# Patient Record
Sex: Female | Born: 1980 | Race: White | Hispanic: No | Marital: Married | Smoking: Current some day smoker
Health system: Southern US, Community
[De-identification: ages and names within clinical notes are randomized; demographics above are authoritative.]

## PROBLEM LIST (undated history)

## (undated) DIAGNOSIS — J020 Streptococcal pharyngitis: Secondary | ICD-10-CM

## (undated) DIAGNOSIS — M549 Dorsalgia, unspecified: Secondary | ICD-10-CM

## (undated) HISTORY — DX: Streptococcal pharyngitis: J02.0

## (undated) HISTORY — DX: Dorsalgia, unspecified: M54.9

## (undated) HISTORY — PX: TUBAL LIGATION: SHX77

---

## 2005-06-09 ENCOUNTER — Observation Stay: Payer: Self-pay | Admitting: Obstetrics & Gynecology

## 2005-06-11 ENCOUNTER — Observation Stay: Payer: Self-pay

## 2005-06-12 ENCOUNTER — Inpatient Hospital Stay: Payer: Self-pay | Admitting: Unknown Physician Specialty

## 2005-06-18 ENCOUNTER — Inpatient Hospital Stay: Payer: Self-pay | Admitting: Unknown Physician Specialty

## 2006-01-04 ENCOUNTER — Ambulatory Visit: Payer: Self-pay | Admitting: Unknown Physician Specialty

## 2007-02-05 ENCOUNTER — Emergency Department: Payer: Self-pay | Admitting: Emergency Medicine

## 2007-06-03 ENCOUNTER — Emergency Department: Payer: Self-pay | Admitting: Emergency Medicine

## 2009-11-25 ENCOUNTER — Ambulatory Visit: Payer: Self-pay | Admitting: Family Medicine

## 2009-11-25 DIAGNOSIS — J020 Streptococcal pharyngitis: Secondary | ICD-10-CM | POA: Insufficient documentation

## 2009-11-25 LAB — CONVERTED CEMR LAB: Rapid Strep: POSITIVE

## 2009-11-29 ENCOUNTER — Telehealth: Payer: Self-pay | Admitting: Family Medicine

## 2009-12-16 ENCOUNTER — Ambulatory Visit: Payer: Self-pay | Admitting: Family Medicine

## 2009-12-16 ENCOUNTER — Encounter: Admission: RE | Admit: 2009-12-16 | Discharge: 2009-12-16 | Payer: Self-pay | Admitting: Family Medicine

## 2009-12-16 DIAGNOSIS — N61 Mastitis without abscess: Secondary | ICD-10-CM

## 2009-12-17 LAB — CONVERTED CEMR LAB
Basophils Absolute: 0.2 10*3/uL — ABNORMAL HIGH (ref 0.0–0.1)
Eosinophils Absolute: 0.2 10*3/uL (ref 0.0–0.7)
Eosinophils Relative: 2.1 % (ref 0.0–5.0)
Hemoglobin: 13.1 g/dL (ref 12.0–15.0)
Lymphs Abs: 2.5 10*3/uL (ref 0.7–4.0)
MCV: 91.7 fL (ref 78.0–100.0)
Monocytes Absolute: 0.5 10*3/uL (ref 0.1–1.0)
Neutro Abs: 8.3 10*3/uL — ABNORMAL HIGH (ref 1.4–7.7)
Neutrophils Relative %: 71.4 % (ref 43.0–77.0)
Platelets: 312 10*3/uL (ref 150.0–400.0)

## 2010-03-01 ENCOUNTER — Telehealth (INDEPENDENT_AMBULATORY_CARE_PROVIDER_SITE_OTHER): Payer: Self-pay | Admitting: *Deleted

## 2010-09-08 ENCOUNTER — Encounter: Payer: Self-pay | Admitting: Family Medicine

## 2010-09-08 ENCOUNTER — Telehealth: Payer: Self-pay | Admitting: Family Medicine

## 2010-09-08 ENCOUNTER — Ambulatory Visit: Payer: Self-pay | Admitting: Family Medicine

## 2010-09-08 DIAGNOSIS — R42 Dizziness and giddiness: Secondary | ICD-10-CM

## 2010-09-08 LAB — CONVERTED CEMR LAB: Hemoglobin: 16.2 g/dL

## 2010-09-09 LAB — CONVERTED CEMR LAB
Basophils Relative: 0.3 % (ref 0.0–3.0)
Eosinophils Relative: 2.9 % (ref 0.0–5.0)
Hemoglobin: 13.6 g/dL (ref 12.0–15.0)
Lymphocytes Relative: 24.8 % (ref 12.0–46.0)
Lymphs Abs: 2.6 10*3/uL (ref 0.7–4.0)
MCV: 89.5 fL (ref 78.0–100.0)
Neutro Abs: 7.1 10*3/uL (ref 1.4–7.7)
Neutrophils Relative %: 67.1 % (ref 43.0–77.0)
RBC: 4.39 M/uL (ref 3.87–5.11)
RDW: 13.7 % (ref 11.5–14.6)

## 2010-10-16 ENCOUNTER — Encounter: Payer: Self-pay | Admitting: Family Medicine

## 2010-10-25 NOTE — Assessment & Plan Note (Signed)
Summary: NEW PT-ACUTE VISIT-STREP THROAT CYD   Vital Signs:  Patient profile:   30 year old female Height:      65 inches Weight:      249.75 pounds BMI:     41.71 Temp:     98.2 degrees F oral Pulse rate:   104 / minute Pulse rhythm:   regular BP sitting:   114 / 76  (left arm) Cuff size:   large  Vitals Entered By: Lewanda Rife LPN (November 25, 1608 9:31 AM)  History of Present Illness: sore throat yest am  then fever last night with aches and chills  was in bed by 7   2 weeks ago was around her nanny with strep   no fever now -- took some tylenol no other otc meds   no congestion or cough and no headache  usually gets strep once per year   no chance pregnant right now  Allergies (verified): No Known Drug Allergies  Past History:  Social History: Last updated: 11/25/2009 occasional smoker - trying to quit   Past Medical History: healthy   Past Surgical History: no surgeries   Social History: occasional smoker - trying to quit   Review of Systems General:  Complains of chills, fatigue, fever, loss of appetite, and malaise. Eyes:  Denies discharge and eye irritation. ENT:  Complains of sore throat; denies nasal congestion, postnasal drainage, and sinus pressure. CV:  Denies chest pain or discomfort and palpitations. Resp:  Denies cough and wheezing. GI:  Denies abdominal pain and change in bowel habits. MS:  Denies joint pain. Derm:  Denies rash. Heme:  Denies abnormal bruising and enlarge lymph nodes.  Physical Exam  General:  overweight but generally well appearing  Head:  normocephalic, atraumatic, and no abnormalities observed.   Eyes:  vision grossly intact, pupils equal, pupils round, pupils reactive to light, and no injection.   Ears:  R ear normal and L ear normal.   Nose:  no nasal discharge.   Mouth:  erythematous throat with tonsillar swelling and scant exudate Neck:  No deformities, masses, or tenderness noted. Lungs:  Normal respiratory  effort, chest expands symmetrically. Lungs are clear to auscultation, no crackles or wheezes. Heart:  Normal rate and regular rhythm. S1 and S2 normal without gallop, murmur, click, rub or other extra sounds. Skin:  Intact without suspicious lesions or rashes Cervical Nodes:  No lymphadenopathy noted Psych:  normal affect, talkative and pleasant    Impression & Recommendations:  Problem # 1:  STREP THROAT (ICD-034.0) Assessment New  uncomplicated with fever and st  tx with amox for 10 days recommend sympt care- see pt instructions   pt advised to update me if symptoms worsen or do not improve  back to work monday if feelingbetter  Her updated medication list for this problem includes:    Tylenol 325 Mg Tabs (Acetaminophen) ..... Otc as directed.    Amoxicillin 500 Mg Caps (Amoxicillin) .Marland Kitchen... 1 by mouth three times a day for 10 days  Orders: Prescription Created Electronically [phone]) Rapid Strep (40981)  Complete Medication List: 1)  Tylenol 325 Mg Tabs (Acetaminophen) .... Otc as directed. 2)  Amoxicillin 500 Mg Caps (Amoxicillin) .Marland Kitchen.. 1 by mouth three times a day for 10 days  Patient Instructions: 1)  drink lots of fluids and get rest  2)  tylenol and chloraseptic spray for throat pain  3)  take amox as directed  4)  update if not improved by monday 5)  can return to work then if feeling better  6)  I sent px to your pharmacy Prescriptions: AMOXICILLIN 500 MG CAPS (AMOXICILLIN) 1 by mouth three times a day for 10 days  #30 x 0   Entered and Authorized by:   Judith Part MD   Signed by:   Judith Part MD on 11/25/2009   Method used:   [facility] to        [id number]       [address]       [place], [place]  [address]       Ph: [phone] or [phone]       Fax: [phone]   RxID:   [id number]   Current Allergies (reviewed today): No known allergies   Laboratory Results  Date/Time Received: November 25, 2009 9:32 AM  Date/Time Reported: November 25, 2009 9:32 AM   Other Tests  Rapid Strep: positive  Kit Test Internal QC: Positive   (Normal Range: Negative)

## 2010-10-25 NOTE — Progress Notes (Signed)
Summary: Not feeling any better  Phone Note Call from Patient Call back at Home Phone [phone]   Caller: Patient Call For: Dr. Milinda Antis Summary of Call: Patient called and states that she is still running low grade fever 100.0 and 100.1, taking Tylenol every 4-6 hours.  Diarrhea since Friday, throat still sore, says its not much better than Thursday.  Able to eat soup and drinking fluids, hurts to swallow.  Wanted to know if she can have something for the diarrhea.    [facility]   Initial call taken by: Linde Gillis CMA Duncan Dull),  November 29, 2009 8:09 AM  Follow-up for Phone Call        since the amox is giving her such bad diarrhea - lets go ahead and try zpak instead  px written on EMR for call in  can try a dose of immodium no more than two times a day-- and stop it if it gives her abd bloating or pain let me know if not starting to improve in several days Follow-up by: Judith Part MD,  November 29, 2009 8:44 AM  Additional Follow-up for Phone Call Additional follow up Details #1::        Mr. Marylou Flesher notified as instructed by telephone. Pt had given Mr Marylou Flesher cell # for contact. Pt to stop Amoxicillin and start Zpak. [facility] as instructed. Lewanda Rife LPN  November 30, 6071 12:53 PM     New/Updated Medications: ZITHROMAX Z-PAK 250 MG TABS (AZITHROMYCIN) take by mouth as directed Prescriptions: ZITHROMAX Z-PAK 250 MG TABS (AZITHROMYCIN) take by mouth as directed  #1 pack x 0   Entered and Authorized by:   Judith Part MD   Signed by:   Lewanda Rife LPN on 71/02/2693   Method used:   Telephoned to ...       [id number]       [address]       [place], [place]  [address]       Ph: [phone] or [phone]       Fax: [phone]   RxID:   [id number]

## 2010-10-25 NOTE — Progress Notes (Signed)
Summary: Breast Ultrasound  Phone Note From Other Clinic Call back at [phone] ext [phone]   Caller: [facility] Call For: Dr. Dayton Martes Summary of Call: Patient needs to reschedule breast ultrasound or call the office and let them know that she is no longer having any problems, finished antibiotics, etc.  They have to have this information on file either way.  Please advise. Initial call taken by: Linde Gillis CMA Duncan Dull),  March 01, 2010 12:07 PM  Follow-up for Phone Call        Will forward to [place]. Ruthe Mannan MD  March 01, 2010 12:08 PM   Spoke with Olegario Messier and updated her that my issue has cleared up.  Follow-up by: Melody Comas,  March 01, 2010 3:19 PM

## 2010-10-25 NOTE — Assessment & Plan Note (Signed)
Summary: sore breast/ alc   Vital Signs:  Patient profile:   30 year old female Height:      65 inches Weight:      249.38 pounds BMI:     41.65 Temp:     98.5 degrees F oral Pulse rate:   88 / minute Pulse rhythm:   regular BP sitting:   122 / 82  (left arm) Cuff size:   large  Vitals Entered By: Delilah Shan CMA Duncan Dull) (December 16, 2009 2:49 PM) CC: Sore breast   History of Present Illness: 49 with 2 days of left breast soreness.  Soreness is in the areaola area, with erythema which is spreadiing outward. No discharge or inversion of nipple. No fevers, chills. She has had more fatigue lately but no other signs of systemic illness. She is a smoker. Not breast feeding and no recent sexual activities involving breast and trauma to her breast.  She is very concerned because maternal great aunt died of breast cancer in her 46s. LMP was last week.   Current Medications (verified): 1)  Tylenol 325 Mg Tabs (Acetaminophen) .... Otc As Directed. 2)  Augmentin 875-125 Mg Tabs (Amoxicillin-Pot Clavulanate) .Marland Kitchen.. 1 By Mouth 2 Times Daily X 10 Days  Allergies (verified): No Known Drug Allergies  Review of Systems      See HPI General:  Complains of fatigue; denies chills and fever. GU:  Denies abnormal vaginal bleeding.  Physical Exam  General:  overweight but generally well appearing  Breasts:  Left areaola red, warm to touch, palpable painful mass under nipple.  no abnormal thickening, no nipple discharge, and no adenopathy.   Psych:  normal affect, talkative and pleasant    Impression & Recommendations:  Problem # 1:  ABSCESS, BREAST, LEFT (ICD-611.0) Assessment New ?possible periductal abscess/mastitis. Will check CBC, start on Augmentin and get immediate ultrasound of left breast.  Orders: Radiology Referral (Radiology) Venipuncture (35361) TLB-CBC Platelet - w/Differential (85025-CBCD)  Complete Medication List: 1)  Tylenol 325 Mg Tabs (Acetaminophen) ....  Otc as directed. 2)  Augmentin 875-125 Mg Tabs (Amoxicillin-pot clavulanate) .Marland Kitchen.. 1 by mouth 2 times daily x 10 days Prescriptions: AUGMENTIN 875-125 MG TABS (AMOXICILLIN-POT CLAVULANATE) 1 by mouth 2 times daily x 10 days  #20 x 0   Entered and Authorized by:   Ruthe Mannan MD   Signed by:   Ruthe Mannan MD on 12/16/2009   Method used:   [facility] to        [id number]       [address]       [place], [place]  [address]       Ph: [phone] or [phone]       Fax: [phone]   RxID:   [id number]   Current Allergies (reviewed today): No known allergies

## 2010-10-27 NOTE — Progress Notes (Signed)
Summary: HGB 16.2, feeling light headed  Phone Note Call from Patient   Call For: Ruthe Mannan MD Summary of Call: Pt not feeling well, lightheaded since around lunch time. HGB 16.2 See Morrie Sheldon with any instructions. Initial call taken by: Mills Koller,  September 08, 2010 1:54 PM  Follow-up for Phone Call        Blood sugar 133 blood pressure 130/90.  Linde Gillis CMA Duncan Dull)  September 08, 2010 2:14 PM  Thank you, let's check a CBC, order placed. Ruthe Mannan MD  September 08, 2010 2:29 PM   New Problems: DIZZINESS (ICD-780.4)   New Problems: DIZZINESS (ICD-780.4)  Laboratory Results   Blood Tests   Date/Time Recieved: September 08, 2010 1:53 PM Date/Time Reported: September 08, 2010 1:53 PM   CBC HGB:  16.2 g/dL   (Normal Range: 52.8-41.3 in Males, 12.0-15.0 in Females)

## 2010-11-21 ENCOUNTER — Telehealth: Payer: Self-pay | Admitting: Family Medicine

## 2010-12-01 NOTE — Progress Notes (Signed)
Summary: Infection in breast  Phone Note Call from Patient Call back at Work Phone [phone]   Caller: Patient Call For: Ruthe Mannan MD Summary of Call: Patient wanted to know if she can have an antibiotic called in because her breast is infected again.  She states that this happens when her menstral cycle starts.  [facility]  Please advise. Initial call taken by: Linde Gillis CMA Duncan Dull),  November 21, 2010 11:35 AM  Follow-up for Phone Call        yes. please have her let me know if infection worsens, i.e if she develops a fever, redness spreads or pain worsens on Augmentin. Ruthe Mannan MD  November 21, 2010 11:43 AM  Patient advised as instructed via telephone.  Rx sent to pharmacy.  Follow-up by: Linde Gillis CMA Duncan Dull),  November 21, 2010 11:57 AM    New/Updated Medications: AUGMENTIN 875-125 MG TABS (AMOXICILLIN-POT CLAVULANATE) 1 by mouth 2 times daily x 10 days Prescriptions: AUGMENTIN 875-125 MG TABS (AMOXICILLIN-POT CLAVULANATE) 1 by mouth 2 times daily x 10 days  #20 x 0   Entered and Authorized by:   Ruthe Mannan MD   Signed by:   Ruthe Mannan MD on 11/21/2010   Method used:   [facility]        [id number]       [address]       [place], [place]  [address]       Ph: [phone] or [phone]       Fax: [phone]   RxID:   [id number]

## 2011-02-09 ENCOUNTER — Other Ambulatory Visit: Payer: Self-pay | Admitting: Family Medicine

## 2011-02-09 ENCOUNTER — Telehealth (INDEPENDENT_AMBULATORY_CARE_PROVIDER_SITE_OTHER): Payer: Self-pay | Admitting: *Deleted

## 2011-02-09 DIAGNOSIS — R5383 Other fatigue: Secondary | ICD-10-CM

## 2011-02-09 DIAGNOSIS — R5381 Other malaise: Secondary | ICD-10-CM

## 2011-02-09 LAB — CBC WITH DIFFERENTIAL/PLATELET
Basophils Absolute: 0 10*3/uL (ref 0.0–0.1)
Basophils Relative: 0.3 % (ref 0.0–3.0)
Eosinophils Absolute: 0.2 10*3/uL (ref 0.0–0.7)
MCV: 90.2 fl (ref 78.0–100.0)
Monocytes Absolute: 0.6 10*3/uL (ref 0.1–1.0)
Neutrophils Relative %: 69.3 % (ref 43.0–77.0)

## 2011-02-09 LAB — TSH: TSH: 2.45 u[IU]/mL (ref 0.35–5.50)

## 2011-02-09 NOTE — Telephone Encounter (Signed)
Patient would like her Thyroid check. Symptoms are fatigue, no period, weight gain. Can she have this done

## 2011-02-09 NOTE — Telephone Encounter (Signed)
Yes. Orders entered

## 2011-02-09 NOTE — Telephone Encounter (Signed)
Patient advised.

## 2011-11-08 ENCOUNTER — Encounter: Payer: Self-pay | Admitting: Internal Medicine

## 2011-11-08 ENCOUNTER — Ambulatory Visit (INDEPENDENT_AMBULATORY_CARE_PROVIDER_SITE_OTHER): Payer: Self-pay | Admitting: Internal Medicine

## 2011-11-08 VITALS — BP 123/85 | HR 84 | Temp 98.4°F

## 2011-11-08 DIAGNOSIS — M79609 Pain in unspecified limb: Secondary | ICD-10-CM

## 2011-11-08 DIAGNOSIS — M79671 Pain in right foot: Secondary | ICD-10-CM | POA: Insufficient documentation

## 2011-11-08 MED ORDER — TRAMADOL HCL 50 MG PO TABS: 50.0000 mg | ORAL_TABLET | Freq: Three times a day (TID) | ORAL | Status: AC | PRN

## 2011-11-08 MED ORDER — CELECOXIB 200 MG PO CAPS: 200.0000 mg | ORAL_CAPSULE | Freq: Two times a day (BID) | ORAL | Status: AC

## 2011-11-08 NOTE — Assessment & Plan Note (Signed)
Acute onset right medial foot pain. Unclear etiology. No obvious trauma, however location of pain and swelling suggest 2nd or 3rd metatarsal fracture. Will get plain xray for evaluation.  Will start Celebrex bid and use tramadol prn severe pain.

## 2011-11-08 NOTE — Progress Notes (Signed)
  Subjective:    Patient ID: Jacqueline Robinson, female    DOB: 1980/12/09, 31 y.o.   MRN: [id number]  HPI 31 year old female presents for acute visit complaining of sudden onset right foot pain. Pain started during the night and woke her from sleep. Pain is described as severe and sharp sometimes radiating around the lateral ankle and up the right leg. Pain is mostly located over the right medial foot, on the top of the foot. There is minimal swelling in this area. There are no overlying skin changes. Pain limits mobility as it is increased with weight bearing. Patient denies any known trauma to the area. However, questions whether dog may have stepped on foot. Patient has not been taking any medication for pain.  Outpatient Encounter Prescriptions as of 11/08/2011  Medication Sig Dispense Refill  . celecoxib (CELEBREX) 200 MG capsule Take 1 capsule (200 mg total) by mouth 2 (two) times daily.  60 capsule  3  . traMADol (ULTRAM) 50 MG tablet Take 1 tablet (50 mg total) by mouth every 8 (eight) hours as needed for pain.  30 tablet  0      Review of Systems  Constitutional: Negative for fever and chills.  Musculoskeletal: Positive for myalgias and arthralgias.  Skin: Negative for color change and rash.   BP 123/85  Pulse 84  Temp 98.4 F (36.9 C)  SpO2 98%     Objective:   Physical Exam  Constitutional: She is oriented to person, place, and time. She appears well-developed and well-nourished. No distress.  HENT:  Head: Normocephalic and atraumatic.  Eyes: Pupils are equal, round, and reactive to light.  Neck: Normal range of motion.  Pulmonary/Chest: Effort normal.  Musculoskeletal:       Right foot: She exhibits tenderness and swelling.       Feet:  Neurological: She is alert and oriented to person, place, and time.  Skin: She is not diaphoretic.          Assessment & Plan:

## 2011-11-09 ENCOUNTER — Ambulatory Visit (INDEPENDENT_AMBULATORY_CARE_PROVIDER_SITE_OTHER)
Admission: RE | Admit: 2011-11-09 | Discharge: 2011-11-09 | Disposition: A | Discharge status: Home / Self Care | Payer: Self-pay | Source: Ambulatory Visit | Attending: Internal Medicine | Admitting: Internal Medicine

## 2011-11-09 DIAGNOSIS — M79609 Pain in unspecified limb: Secondary | ICD-10-CM

## 2011-11-09 DIAGNOSIS — M79671 Pain in right foot: Secondary | ICD-10-CM

## 2011-11-12 ENCOUNTER — Other Ambulatory Visit: Payer: Self-pay | Admitting: Internal Medicine

## 2011-11-12 DIAGNOSIS — J020 Streptococcal pharyngitis: Secondary | ICD-10-CM

## 2011-11-12 MED ORDER — AMOXICILLIN 500 MG PO CAPS: 500.0000 mg | ORAL_CAPSULE | Freq: Three times a day (TID) | ORAL | Status: AC

## 2011-12-20 ENCOUNTER — Other Ambulatory Visit: Payer: Self-pay | Admitting: Internal Medicine

## 2011-12-20 DIAGNOSIS — N390 Urinary tract infection, site not specified: Secondary | ICD-10-CM

## 2011-12-20 MED ORDER — SULFAMETHOXAZOLE-TRIMETHOPRIM 800-160 MG PO TABS: 1.0000 | ORAL_TABLET | Freq: Two times a day (BID) | ORAL | Status: AC

## 2013-04-15 ENCOUNTER — Ambulatory Visit: Payer: Self-pay | Admitting: Internal Medicine

## 2014-06-15 ENCOUNTER — Ambulatory Visit: Payer: Self-pay | Admitting: Internal Medicine

## 2015-02-18 ENCOUNTER — Other Ambulatory Visit: Payer: Self-pay | Admitting: Otolaryngology

## 2015-02-18 DIAGNOSIS — H9202 Otalgia, left ear: Secondary | ICD-10-CM

## 2015-02-25 ENCOUNTER — Ambulatory Visit: Admission: RE | Admit: 2015-02-25 | Payer: 59 | Source: Ambulatory Visit

## 2015-03-05 ENCOUNTER — Ambulatory Visit
Admission: RE | Admit: 2015-03-05 | Discharge: 2015-03-05 | Disposition: A | Discharge status: Home / Self Care | Payer: 59 | Source: Ambulatory Visit | Attending: Otolaryngology | Admitting: Otolaryngology

## 2015-03-05 DIAGNOSIS — H9202 Otalgia, left ear: Secondary | ICD-10-CM | POA: Insufficient documentation

## 2015-03-05 MED ORDER — GADOBENATE DIMEGLUMINE 529 MG/ML IV SOLN
20.0000 mL | Freq: Once | INTRAVENOUS | Status: AC | PRN
  Administered 2015-03-05: 20 mL via INTRAVENOUS

## 2016-02-05 ENCOUNTER — Other Ambulatory Visit: Payer: Self-pay | Admitting: Cardiology

## 2016-02-05 DIAGNOSIS — R0602 Shortness of breath: Secondary | ICD-10-CM

## 2016-02-05 DIAGNOSIS — J209 Acute bronchitis, unspecified: Secondary | ICD-10-CM

## 2016-03-22 DIAGNOSIS — H52223 Regular astigmatism, bilateral: Secondary | ICD-10-CM | POA: Diagnosis not present

## 2016-05-11 ENCOUNTER — Ambulatory Visit
Admission: RE | Admit: 2016-05-11 | Discharge: 2016-05-11 | Disposition: A | Discharge status: Home / Self Care | Payer: 59 | Source: Ambulatory Visit | Attending: Internal Medicine | Admitting: Internal Medicine

## 2016-05-11 ENCOUNTER — Ambulatory Visit
Admission: RE | Admit: 2016-05-11 | Discharge: 2016-05-11 | Disposition: A | Discharge status: Home / Self Care | Payer: 59 | Source: Ambulatory Visit | Attending: Cardiology | Admitting: Cardiology

## 2016-05-11 DIAGNOSIS — R0602 Shortness of breath: Secondary | ICD-10-CM

## 2016-05-11 DIAGNOSIS — J209 Acute bronchitis, unspecified: Secondary | ICD-10-CM | POA: Insufficient documentation

## 2016-05-11 DIAGNOSIS — R05 Cough: Secondary | ICD-10-CM | POA: Diagnosis not present

## 2016-05-19 DIAGNOSIS — R5381 Other malaise: Secondary | ICD-10-CM | POA: Diagnosis not present

## 2016-05-19 DIAGNOSIS — E784 Other hyperlipidemia: Secondary | ICD-10-CM | POA: Diagnosis not present

## 2016-05-19 DIAGNOSIS — R635 Abnormal weight gain: Secondary | ICD-10-CM | POA: Diagnosis not present

## 2016-05-19 DIAGNOSIS — I10 Essential (primary) hypertension: Secondary | ICD-10-CM | POA: Diagnosis not present

## 2016-06-01 DIAGNOSIS — R5381 Other malaise: Secondary | ICD-10-CM | POA: Diagnosis not present

## 2016-06-01 DIAGNOSIS — D72828 Other elevated white blood cell count: Secondary | ICD-10-CM | POA: Diagnosis not present

## 2016-06-02 DIAGNOSIS — R5381 Other malaise: Secondary | ICD-10-CM | POA: Diagnosis not present

## 2016-06-02 DIAGNOSIS — D72829 Elevated white blood cell count, unspecified: Secondary | ICD-10-CM | POA: Diagnosis not present

## 2016-06-02 DIAGNOSIS — R635 Abnormal weight gain: Secondary | ICD-10-CM | POA: Diagnosis not present

## 2016-06-02 DIAGNOSIS — D72828 Other elevated white blood cell count: Secondary | ICD-10-CM | POA: Diagnosis not present

## 2016-06-13 DIAGNOSIS — D72828 Other elevated white blood cell count: Secondary | ICD-10-CM | POA: Diagnosis not present

## 2016-06-13 DIAGNOSIS — D72829 Elevated white blood cell count, unspecified: Secondary | ICD-10-CM | POA: Diagnosis not present

## 2016-06-13 DIAGNOSIS — R5381 Other malaise: Secondary | ICD-10-CM | POA: Diagnosis not present

## 2016-06-14 DIAGNOSIS — R5381 Other malaise: Secondary | ICD-10-CM | POA: Diagnosis not present

## 2016-06-14 DIAGNOSIS — R609 Edema, unspecified: Secondary | ICD-10-CM | POA: Diagnosis not present

## 2016-06-14 DIAGNOSIS — D72828 Other elevated white blood cell count: Secondary | ICD-10-CM | POA: Diagnosis not present

## 2016-06-19 DIAGNOSIS — D72829 Elevated white blood cell count, unspecified: Secondary | ICD-10-CM | POA: Diagnosis not present

## 2016-06-19 DIAGNOSIS — R5381 Other malaise: Secondary | ICD-10-CM | POA: Diagnosis not present

## 2016-06-19 DIAGNOSIS — D72828 Other elevated white blood cell count: Secondary | ICD-10-CM | POA: Diagnosis not present

## 2016-06-23 DIAGNOSIS — D72828 Other elevated white blood cell count: Secondary | ICD-10-CM | POA: Diagnosis not present

## 2016-06-23 DIAGNOSIS — R5381 Other malaise: Secondary | ICD-10-CM | POA: Diagnosis not present

## 2016-06-23 DIAGNOSIS — D72829 Elevated white blood cell count, unspecified: Secondary | ICD-10-CM | POA: Diagnosis not present

## 2016-06-29 DIAGNOSIS — R5381 Other malaise: Secondary | ICD-10-CM | POA: Diagnosis not present

## 2016-06-29 DIAGNOSIS — D72829 Elevated white blood cell count, unspecified: Secondary | ICD-10-CM | POA: Diagnosis not present

## 2016-06-29 DIAGNOSIS — D72828 Other elevated white blood cell count: Secondary | ICD-10-CM | POA: Diagnosis not present

## 2016-07-04 ENCOUNTER — Inpatient Hospital Stay: Payer: 59

## 2016-07-04 ENCOUNTER — Encounter (INDEPENDENT_AMBULATORY_CARE_PROVIDER_SITE_OTHER): Payer: Self-pay

## 2016-07-04 ENCOUNTER — Inpatient Hospital Stay: Payer: 59 | Admitting: Hematology and Oncology

## 2016-07-04 ENCOUNTER — Other Ambulatory Visit: Payer: Self-pay

## 2016-07-04 ENCOUNTER — Inpatient Hospital Stay: Payer: 59 | Attending: Internal Medicine | Admitting: Internal Medicine

## 2016-07-04 VITALS — BP 155/103 | HR 90 | Temp 98.1°F | Ht 69.0 in | Wt 294.5 lb

## 2016-07-04 DIAGNOSIS — L732 Hidradenitis suppurativa: Secondary | ICD-10-CM | POA: Diagnosis not present

## 2016-07-04 DIAGNOSIS — I10 Essential (primary) hypertension: Secondary | ICD-10-CM | POA: Insufficient documentation

## 2016-07-04 DIAGNOSIS — R21 Rash and other nonspecific skin eruption: Secondary | ICD-10-CM | POA: Diagnosis not present

## 2016-07-04 DIAGNOSIS — F17210 Nicotine dependence, cigarettes, uncomplicated: Secondary | ICD-10-CM | POA: Insufficient documentation

## 2016-07-04 DIAGNOSIS — Z79899 Other long term (current) drug therapy: Secondary | ICD-10-CM | POA: Diagnosis not present

## 2016-07-04 DIAGNOSIS — D72829 Elevated white blood cell count, unspecified: Secondary | ICD-10-CM | POA: Insufficient documentation

## 2016-07-04 LAB — CBC WITH DIFFERENTIAL/PLATELET
BASOS ABS: 0.1 10*3/uL (ref 0–0.1)
BASOS PCT: 1 %
Eosinophils Absolute: 0.3 10*3/uL (ref 0–0.7)
Eosinophils Relative: 3 %
HEMATOCRIT: 43.1 % (ref 35.0–47.0)
Hemoglobin: 14.6 g/dL (ref 12.0–16.0)
LYMPHS PCT: 21 %
Lymphs Abs: 2.4 10*3/uL (ref 1.0–3.6)
MCH: 28.8 pg (ref 26.0–34.0)
MCHC: 33.9 g/dL (ref 32.0–36.0)
MCV: 85 fL (ref 80.0–100.0)
MONO ABS: 0.6 10*3/uL (ref 0.2–0.9)
Monocytes Relative: 5 %
Neutro Abs: 8.2 10*3/uL — ABNORMAL HIGH (ref 1.4–6.5)
Neutrophils Relative %: 70 %
Platelets: 331 10*3/uL (ref 150–440)
RBC: 5.07 MIL/uL (ref 3.80–5.20)
RDW: 14.2 % (ref 11.5–14.5)
WBC: 11.6 10*3/uL — AB (ref 3.6–11.0)

## 2016-07-04 NOTE — Progress Notes (Signed)
[facility]  [phone] [phone]) [phone]  ID: Jacqueline Robinson OB: 1981/07/04  MR#: [id number]  AOZ#:[id number]  Patient Care Team: Corky Downs, MD as PCP - General (Internal Medicine)  CHIEF COMPLAINT: Leujkocytosis - predominantly neutrophilai - mild ntoiced first in August 2017   INTERVAL HISTORY: Had been feeling genralized weakness and mutliple sytemic symptoms including diffuse back and leg pains, easy fatiguability , pressure like headaches on and off. Also noticed a rash that is blistery - blood filled at first, then they break and scar proedominalty on the abdomen but also under her arms and inguinal areas. The rash is painless and non itchy. No rahs on th eface This rash began about 04/2016 Her symtposm are constant with no escalation or imrpovemnt . She denisn havign nay fever or [place] eof any ne wmedication  Heavy mesntrual periods for sevarl years, evlauate dby gyn in th epast- not on hormonal teherapy    REVIEW OF SYSTEMS:   ROS  As per HPI. Otherwise, a complete review of systems is negative.  PAST MEDICAL HISTORY: History reviewed. No pertinent past medical history.  PAST SURGICAL HISTORY: History reviewed. No pertinent surgical history.  FAMILY HISTORY: History reviewed. No pertinent family history.  ADVANCED DIRECTIVES (Y/N):  N  HEALTH MAINTENANCE: Social History  Substance Use Topics  . Smoking status: Current Every Day Smoker  . Smokeless tobacco: Never Used  . Alcohol use No     Colonoscopy:  PAP:  Bone density:  Lipid panel:  Not on File  No current outpatient prescriptions on file.   No current facility-administered medications for this visit.     OBJECTIVE: Vitals:   07/04/16 0951  BP: (!) 155/103  Pulse: 90  Temp: 98.1 F (36.7 C)     Body mass index is 43.49 kg/m.   2.55 meters squared  ECOG FS:1 - Symptomatic but completely ambulatory  Physical Exam  Constitutional: She is oriented to  person, place, and time. She appears well-developed and well-nourished.  Overweight  HENT:  Head: Normocephalic and atraumatic.  Eyes: Conjunctivae and EOM are normal. Pupils are equal, round, and reactive to light.  Neck: Normal range of motion. Neck supple.  Cardiovascular: Normal rate and regular rhythm.   Elevated BP noted by repeat check in both arms  Pulmonary/Chest: Effort normal and breath sounds normal.  Abdominal: Soft.  Musculoskeletal: Normal range of motion.  Neurological: She is alert and oriented to person, place, and time.  Skin: Rash noted.  mutliple areas of scaly red patches noted over the baodmen, axaille and inguinal areas. Some vesicules were noted as well.  Psychiatric: She has a normal mood and affect. Her behavior is normal. Judgment and thought content normal.       LAB RESULTS:  No results found for: NA, K, CL, CO2, GLUCOSE, BUN, CREATININE, CALCIUM, PROT, ALBUMIN, AST, ALT, ALKPHOS, BILITOT, GFRNONAA, GFRAA  Lab Results  Component Value Date   WBC 11.6 (H) 07/04/2016   NEUTROABS 8.2 (H) 07/04/2016   HGB 14.6 07/04/2016   HCT 43.1 07/04/2016   MCV 85.0 07/04/2016   PLT 331 07/04/2016     STUDIES: No results found.  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 I wlll review the peripheral blood smear and will request for leukemia panel flow cytometry.  The blistery painless skin lesions are suspicious for pemphigus. However, sweets syndrome and Porphyria cutanea tarda are also in the differential diagnosis.  I will arrange for a biopsy of the skin lesion and derm evaluation first before proceeding with work up for porphyria.   Accelerated HTN, repeat measurement also persists high at 150/105, She is unaware of Htn before . I spoke to her PCP Dr. Juel BurrowMasoud over the phone and defer further management to him.    Return in about 1 week (around  07/11/2016). No matching staging information was found for the patient.    PLAN:    Orders Placed This Encounter  Procedures  . CBC with Differential    prior to pffice visit today Also please make a slide for my review.    Standing Status:   Future    Number of Occurrences:   1    Standing Expiration Date:   07/04/2017  . Flow cytometry panel-leukemia/lymphoma work-up    Standing Status:   Future    Standing Expiration Date:   07/04/2017  . Ambulatory referral to Dermatology    Referral Priority:   Urgent    Referral Type:   Consultation    Referral Reason:   Specialty Services Required    Requested Specialty:   Dermatology    Number of Visits Requested:   1   Patient expressed understanding and was in agreement with this plan. She also understands that She can call clinic at any time with any questions, concerns, or complaints.    Towana BadgerSirisha Alethea Terhaar, MD   07/04/2016 10:40 AM

## 2016-07-04 NOTE — Progress Notes (Signed)
Patient here for initial visit. Since June she experienced generalized pain especially deep bone pain, she is also tired all the time. She has diffuse numerous lesions on her trunk. She also reports episodes of dyspnea. Her BP was elevated today she states itis usually normal. She will monitor daily and see PCP if it remains elevated.

## 2016-07-06 ENCOUNTER — Telehealth: Payer: Self-pay | Admitting: Internal Medicine

## 2016-07-06 NOTE — Telephone Encounter (Signed)
See reason for call

## 2016-07-07 DIAGNOSIS — D72828 Other elevated white blood cell count: Secondary | ICD-10-CM | POA: Diagnosis not present

## 2016-07-07 DIAGNOSIS — R5381 Other malaise: Secondary | ICD-10-CM | POA: Diagnosis not present

## 2016-07-11 ENCOUNTER — Inpatient Hospital Stay: Payer: 59

## 2016-07-11 ENCOUNTER — Encounter: Payer: Self-pay | Admitting: Internal Medicine

## 2016-07-11 ENCOUNTER — Inpatient Hospital Stay (HOSPITAL_BASED_OUTPATIENT_CLINIC_OR_DEPARTMENT_OTHER): Payer: 59 | Admitting: Internal Medicine

## 2016-07-11 VITALS — BP 134/97 | HR 102 | Temp 96.4°F | Wt 298.5 lb

## 2016-07-11 DIAGNOSIS — D72829 Elevated white blood cell count, unspecified: Secondary | ICD-10-CM

## 2016-07-11 DIAGNOSIS — F17210 Nicotine dependence, cigarettes, uncomplicated: Secondary | ICD-10-CM | POA: Diagnosis not present

## 2016-07-11 DIAGNOSIS — D72821 Monocytosis (symptomatic): Secondary | ICD-10-CM

## 2016-07-11 DIAGNOSIS — R21 Rash and other nonspecific skin eruption: Secondary | ICD-10-CM

## 2016-07-11 DIAGNOSIS — R197 Diarrhea, unspecified: Secondary | ICD-10-CM | POA: Insufficient documentation

## 2016-07-11 DIAGNOSIS — Z79899 Other long term (current) drug therapy: Secondary | ICD-10-CM

## 2016-07-11 DIAGNOSIS — I10 Essential (primary) hypertension: Secondary | ICD-10-CM | POA: Diagnosis not present

## 2016-07-11 DIAGNOSIS — R110 Nausea: Secondary | ICD-10-CM | POA: Insufficient documentation

## 2016-07-13 NOTE — Assessment & Plan Note (Addendum)
She complains of persistent nausea but no vomiting. Appetite is ok,  Also has frequent diarrhea for some time Ultrasound liver and spleen Refer to GI for endoscopy

## 2016-07-13 NOTE — Progress Notes (Signed)
[facility]  Telephone:(336) [phone] Fax:(336) [phone]  ID: Jacqueline Robinson OB: [birth year]  MR#: [id number]  GEX#:[id number]#:[id number]  Patient Care Team: Corky DownsJaved Masoud, MD as PCP - General (Internal Medicine)  CHIEF COMPLAINT: Leujkocytosis   Interval history: Seen by dermatology- rash is sligthly better since derma began Doxycycline last week  She also began Norvasc for HTN and is managed by Dr. Harl BowieMassoud, Bp is better now Symptoms reported last visit persists as noted in ROS   REVIEW OF SYSTEMS:   Review of Systems  Constitutional: Negative for malaise/fatigue.  Gastrointestinal: Positive for diarrhea and nausea.  Musculoskeletal: Positive for back pain, joint pain and myalgias.  Skin: Positive for itching and rash.  Neurological: Positive for headaches.  All other systems reviewed and are negative.   As per HPI. Otherwise, a complete review of systems is negative.  PAST MEDICAL HISTORY: Past Medical History:  Diagnosis Date  . Back pain   . Strep throat     PAST SURGICAL HISTORY: History reviewed. No pertinent surgical history.  FAMILY HISTORY: History reviewed. No pertinent family history.  ADVANCED DIRECTIVES (Y/N):  N  HEALTH MAINTENANCE: Social History  Substance Use Topics  . Smoking status: Current Every Day Smoker    Packs/day: 0.50    Types: Cigarettes  . Smokeless tobacco: Never Used  . Alcohol use No     Colonoscopy:  PAP:  Bone density:  Lipid panel:  No Known Allergies  No current outpatient prescriptions on file.   No current facility-administered medications for this visit.     OBJECTIVE: Vitals:   07/11/16 0923  BP: (!) 134/97  Pulse: (!) 102  Temp: (!) 96.4 F (35.8 C)     Body mass index is 44.08 kg/m.   2.57 meters squared  ECOG FS:1 - Symptomatic but completely ambulatory  Physical Exam  Skin:  Some areas of rash appear to be crusting    Leukocytosis Leukocytosis, reactive to skin lesions Flow  cytometry was not sent last visit even though it was ordered. Will reorder And also request BCR- ABL  She has been seen by dermatology who feel this is Hydradenitis, she is no won Doxycycline, she notes mild improvement in rash Will continue to look for improvement in leukocytosis.  Rash and other nonspecific skin eruption She is still concerned about excessive fatigue and achiness in multlple joints Will request a rheumatology referral for fibromyalgia and other autoimmune causes  Nausea without vomiting She complains of persistent nausea but no vomiting. Appetite is ok,  Also has frequent diarrhea for some time Ultrasound liver and spleen Refer to GI for endoscopy      PLAN:    Orders Placed This Encounter  Procedures  . US Abdomen Complete    Standing Status:   Future    Standing Expiration Date:   09/10/2017    Order Specific Question:   Reason for Exam (SYMPTOM  OR DIAGNOSIS REQUIRED)    Answer:   nausea, leukocytosis, looking for liver or spleen pathology    Order Specific Question:   Preferred imaging location?    Answer:   [facility]  . BCR-ABL1, CML/ALL, PCR, QUANT    Standing Status:   Future    Number of Occurrences:   1    Standing Expiration Date:   07/11/2017  . Ambulatory referral to Gastroenterology    Referral Priority:   Routine    Referral Type:   Consultation    Referral Reason:   Specialty Services Required  Number of Visits Requested:   1  . Ambulatory referral to Rheumatology    Referral Priority:   Routine    Referral Type:   Consultation    Referral Reason:   Specialty Services Required    Requested Specialty:   Rheumatology    Number of Visits Requested:   1   Patient expressed understanding and was in agreement with this plan. She also understands that She can call clinic at any time with any questions, concerns, or complaints.    Towana Badger, MD   07/13/2016 4:55 PM

## 2016-07-13 NOTE — Assessment & Plan Note (Signed)
She is still concerned about excessive fatigue and achiness in multlple joints Will request a rheumatology referral for fibromyalgia and other autoimmune causes

## 2016-07-13 NOTE — Assessment & Plan Note (Signed)
Leukocytosis, reactive to skin lesions Flow cytometry was not sent last visit even though it was ordered. Will reorder And also request BCR- ABL  She has been seen by dermatology who feel this is Hydradenitis, she is no won Doxycycline, she notes mild improvement in rash Will continue to look for improvement in leukocytosis.

## 2016-07-14 ENCOUNTER — Ambulatory Visit
Admission: RE | Admit: 2016-07-14 | Discharge: 2016-07-14 | Disposition: A | Discharge status: Home / Self Care | Payer: 59 | Source: Ambulatory Visit | Attending: Internal Medicine | Admitting: Internal Medicine

## 2016-07-14 DIAGNOSIS — R932 Abnormal findings on diagnostic imaging of liver and biliary tract: Secondary | ICD-10-CM | POA: Diagnosis not present

## 2016-07-14 DIAGNOSIS — D72821 Monocytosis (symptomatic): Secondary | ICD-10-CM | POA: Diagnosis not present

## 2016-07-14 DIAGNOSIS — R110 Nausea: Secondary | ICD-10-CM | POA: Diagnosis not present

## 2016-07-17 LAB — BCR-ABL1, CML/ALL, PCR, QUANT

## 2016-07-18 LAB — COMP PANEL: LEUKEMIA/LYMPHOMA

## 2016-08-03 ENCOUNTER — Emergency Department: Payer: 59

## 2016-08-03 ENCOUNTER — Emergency Department
Admission: EM | Admit: 2016-08-03 | Discharge: 2016-08-04 | Disposition: A | Discharge status: Home / Self Care | Payer: 59 | Attending: Emergency Medicine | Admitting: Emergency Medicine

## 2016-08-03 ENCOUNTER — Encounter: Payer: Self-pay | Admitting: Emergency Medicine

## 2016-08-03 DIAGNOSIS — Y9302 Activity, running: Secondary | ICD-10-CM | POA: Insufficient documentation

## 2016-08-03 DIAGNOSIS — W010XXA Fall on same level from slipping, tripping and stumbling without subsequent striking against object, initial encounter: Secondary | ICD-10-CM | POA: Insufficient documentation

## 2016-08-03 DIAGNOSIS — S82854A Nondisplaced trimalleolar fracture of right lower leg, initial encounter for closed fracture: Secondary | ICD-10-CM | POA: Diagnosis not present

## 2016-08-03 DIAGNOSIS — Y999 Unspecified external cause status: Secondary | ICD-10-CM | POA: Insufficient documentation

## 2016-08-03 DIAGNOSIS — F17210 Nicotine dependence, cigarettes, uncomplicated: Secondary | ICD-10-CM | POA: Insufficient documentation

## 2016-08-03 DIAGNOSIS — Y92094 Garage of other non-institutional residence as the place of occurrence of the external cause: Secondary | ICD-10-CM | POA: Insufficient documentation

## 2016-08-03 DIAGNOSIS — S99911A Unspecified injury of right ankle, initial encounter: Secondary | ICD-10-CM | POA: Diagnosis present

## 2016-08-03 DIAGNOSIS — S82851A Displaced trimalleolar fracture of right lower leg, initial encounter for closed fracture: Secondary | ICD-10-CM

## 2016-08-03 NOTE — ED Triage Notes (Signed)
Pt to triage via w/c with no distress noted; reports injuring right ankle PTA while outside with her dogs

## 2016-08-04 ENCOUNTER — Emergency Department: Payer: 59

## 2016-08-04 DIAGNOSIS — S82854A Nondisplaced trimalleolar fracture of right lower leg, initial encounter for closed fracture: Secondary | ICD-10-CM | POA: Diagnosis not present

## 2016-08-04 DIAGNOSIS — F17210 Nicotine dependence, cigarettes, uncomplicated: Secondary | ICD-10-CM | POA: Diagnosis not present

## 2016-08-04 DIAGNOSIS — S82851A Displaced trimalleolar fracture of right lower leg, initial encounter for closed fracture: Secondary | ICD-10-CM | POA: Diagnosis not present

## 2016-08-04 MED ORDER — HYDROMORPHONE HCL 1 MG/ML IJ SOLN
1.0000 mg | Freq: Once | INTRAMUSCULAR | Status: AC
Start: 2016-08-04 — End: 2016-08-04
  Administered 2016-08-04: 1 mg via INTRAMUSCULAR
  Filled 2016-08-04: qty 1

## 2016-08-04 MED ORDER — OXYCODONE-ACETAMINOPHEN 5-325 MG PO TABS: 1.0000 | ORAL_TABLET | Freq: Four times a day (QID) | ORAL | 0 refills | Status: DC | PRN

## 2016-08-04 MED ORDER — ONDANSETRON 4 MG PO TBDP: 4.0000 mg | ORAL_TABLET | Freq: Three times a day (TID) | ORAL | 0 refills | Status: DC | PRN

## 2016-08-04 MED ORDER — OXYCODONE-ACETAMINOPHEN 5-325 MG PO TABS
1.0000 | ORAL_TABLET | Freq: Once | ORAL | Status: AC
  Administered 2016-08-04: 1 via ORAL
  Filled 2016-08-04: qty 1

## 2016-08-04 NOTE — ED Notes (Signed)

## 2016-08-04 NOTE — ED Provider Notes (Signed)
[facility] Emergency Department Provider Note  ____________________________________________  Time seen: Approximately 2:41 AM  I have reviewed the triage vital signs and the nursing notes.   HISTORY  Chief Complaint Ankle Pain    HPI Jobie Quakershley L Brenning is a 35 y.o. female was in the garage with her 2 great Dane dog's this evening when they're running around on a wet surface and slipped and fell into her leg. This caused her severe right ankle pain immediately. She's had difficulty with weightbearing since then. Pain is 10 out of 10. Nonradiating. No knee pain. No other injuries. No paresthesias.     Past Medical History:  Diagnosis Date  . Back pain   . Strep throat      Patient Active Problem List   Diagnosis Date Noted  . Nausea without vomiting 07/11/2016  . Diarrhea 07/11/2016  . Leukocytosis 07/04/2016  . Rash and other nonspecific skin eruption 07/04/2016  . Foot pain, right 11/08/2011  . DIZZINESS 09/08/2010  . ABSCESS, BREAST, LEFT 12/16/2009  . STREP THROAT 11/25/2009     History reviewed. No pertinent surgical history.   Prior to Admission medications   Medication Sig Start Date End Date Taking? Authorizing Provider  ondansetron (ZOFRAN ODT) 4 MG disintegrating tablet Take 1 tablet (4 mg total) by mouth every 8 (eight) hours as needed for nausea or vomiting. 08/04/16   Sharman CheekPhillip Sada Mazzoni, MD  oxyCODONE-acetaminophen (ROXICET) 5-325 MG tablet Take 1 tablet by mouth every 6 (six) hours as needed for severe pain. 08/04/16   Sharman CheekPhillip Antara Brecheisen, MD     Allergies Patient has no known allergies.   No family history on file.  Social History Social History  Substance Use Topics  . Smoking status: Current Every Day Smoker    Packs/day: 0.50    Types: Cigarettes  . Smokeless tobacco: Never Used  . Alcohol use No    Review of Systems  Constitutional:   No fever or chills.  Cardiovascular:   No chest pain. Respiratory:   No  dyspnea or cough. Musculoskeletal:   Severe right ankle pain Neurological:   Negative for headaches 10-point ROS otherwise negative.  ____________________________________________   PHYSICAL EXAM:  VITAL SIGNS: ED Triage Vitals  Enc Vitals Group     BP 08/03/16 2340 (!) 173/96     Pulse Rate 08/03/16 2340 85     Resp 08/03/16 2340 20     Temp 08/03/16 2340 98 F (36.7 C)     Temp Source 08/03/16 2340 Oral     SpO2 08/03/16 2340 98 %     Weight 08/03/16 2339 290 lb (131.5 kg)     Height 08/03/16 2339 5\' 8"  (1.727 m)     Head Circumference --      Peak Flow --      Pain Score 08/03/16 2339 10     Pain Loc --      Pain Edu? --      Excl. in GC? --     Vital signs reviewed, nursing assessments reviewed.   Constitutional:   Alert and oriented. Well appearing and in no distress. Eyes:   No scleral icterus. PERRL. ENT   Head:   Normocephalic and atraumatic. Cardiovascular: Normal and symmetric dorsalis pedis pulses bilaterally. Normal capillary refill in the toes of the right side. Musculoskeletal:   Right ankle swelling with diffuse tenderness on all sides. Neurologic:   Normal speech and language.  CN 2-10 normal. Motor grossly intact.  No gross focal neurologic deficits  are appreciated.  Skin:    Skin is warm, dry and intact. No rash noted.  No petechiae, purpura, or bullae.  ____________________________________________    LABS (pertinent positives/negatives) (all labs ordered are listed, but only abnormal results are displayed) Labs Reviewed - No data to display ____________________________________________   EKG    ____________________________________________    RADIOLOGY  X-ray right ankle reveals nondisplaced trimalleolar fracture with possible subtalar fracture. CT ankle demonstrates trimalleolar fracture with minimal displacement. No talus fracture identified. ____________________________________________   PROCEDURES Procedures SPLINT  APPLICATION Date/Time: 2:43 AM Authorized by: Sharman CheekSTAFFORD, Wilton Thrall Consent: Verbal consent obtained. Risks and benefits: risks, benefits and alternatives were discussed Consent given by: patient Splint applied by: Myself and orthopedic technician Location details: Right ankle  Splint type: Short leg 3 sided  Supplies used: Ortho-Glass, Ace wrap  Post-procedure: The splinted body part was neurovascularly unchanged following the procedure. Patient tolerance: Patient tolerated the procedure well with no immediate complications.    ____________________________________________   INITIAL IMPRESSION / ASSESSMENT AND PLAN / ED COURSE  Pertinent labs & imaging results that were available during my care of the patient were reviewed by me and considered in my medical decision making (see chart for details).  Patient presents with ankle pain, x-ray consistent with trimalleolar fracture, also raises question of talar fracture.     Clinical Course as of Aug 04 240  Fri Aug 04, 2016  [id number] D/w ortho Rosita KeaMenz. Rec. CT ankle to further eval. Poss talus fx.  If positive, would proceed with transfer to med center ortho.   [PS]    Clinical Course User Index [PS] Sharman CheekPhillip Rhilee Currin, MD    ----------------------------------------- 2:44 AM on 08/04/2016 -----------------------------------------  CT negative for talus fracture. Per my discussion with Dr. Rosita KeaMenz earlier, we'll place a stirrup and posterior short leg splint, pain control, crutches, return precautions and counseling on safety with pain meds and crutches, call Dr. Neomia GlassMenz's clinic this afternoon for follow-up early next week. Counseled on warning signs of compartment syndrome. No evidence of severe instability or neurovascular injury at this time. ____________________________________________   FINAL CLINICAL IMPRESSION(S) / ED DIAGNOSES  Final diagnoses:  Trimalleolar fracture of ankle, closed, right, initial encounter       Portions of  this note were generated with dragon dictation software. Dictation errors may occur despite best attempts at proofreading.    Sharman CheekPhillip Roshini Fulwider, MD 08/04/16 [id number]

## 2016-08-08 DIAGNOSIS — S82851A Displaced trimalleolar fracture of right lower leg, initial encounter for closed fracture: Secondary | ICD-10-CM | POA: Diagnosis not present

## 2016-08-10 ENCOUNTER — Ambulatory Visit
Admission: RE | Admit: 2016-08-10 | Discharge: 2016-08-10 | Disposition: A | Discharge status: Home / Self Care | Payer: 59 | Source: Ambulatory Visit | Attending: Orthopedic Surgery | Admitting: Orthopedic Surgery

## 2016-08-10 ENCOUNTER — Ambulatory Visit: Payer: 59 | Admitting: Anesthesiology

## 2016-08-10 ENCOUNTER — Encounter
Admission: RE | Disposition: A | Discharge status: Home / Self Care | Payer: Self-pay | Source: Ambulatory Visit | Attending: Orthopedic Surgery

## 2016-08-10 ENCOUNTER — Ambulatory Visit: Payer: 59

## 2016-08-10 ENCOUNTER — Encounter: Payer: Self-pay | Admitting: *Deleted

## 2016-08-10 DIAGNOSIS — Z6841 Body Mass Index (BMI) 40.0 and over, adult: Secondary | ICD-10-CM | POA: Insufficient documentation

## 2016-08-10 DIAGNOSIS — E669 Obesity, unspecified: Secondary | ICD-10-CM | POA: Diagnosis not present

## 2016-08-10 DIAGNOSIS — Y998 Other external cause status: Secondary | ICD-10-CM | POA: Insufficient documentation

## 2016-08-10 DIAGNOSIS — Z8781 Personal history of (healed) traumatic fracture: Secondary | ICD-10-CM

## 2016-08-10 DIAGNOSIS — Z9889 Other specified postprocedural states: Secondary | ICD-10-CM

## 2016-08-10 DIAGNOSIS — S9301XA Subluxation of right ankle joint, initial encounter: Secondary | ICD-10-CM | POA: Diagnosis not present

## 2016-08-10 DIAGNOSIS — Y92015 Private garage of single-family (private) house as the place of occurrence of the external cause: Secondary | ICD-10-CM | POA: Diagnosis not present

## 2016-08-10 DIAGNOSIS — Y9389 Activity, other specified: Secondary | ICD-10-CM | POA: Diagnosis not present

## 2016-08-10 DIAGNOSIS — Z87891 Personal history of nicotine dependence: Secondary | ICD-10-CM | POA: Diagnosis not present

## 2016-08-10 DIAGNOSIS — X501XXA Overexertion from prolonged static or awkward postures, initial encounter: Secondary | ICD-10-CM | POA: Diagnosis not present

## 2016-08-10 DIAGNOSIS — S82851A Displaced trimalleolar fracture of right lower leg, initial encounter for closed fracture: Secondary | ICD-10-CM | POA: Insufficient documentation

## 2016-08-10 HISTORY — PX: ORIF ANKLE FRACTURE: SHX5408

## 2016-08-10 LAB — POCT PREGNANCY, URINE: PREG TEST UR: NEGATIVE

## 2016-08-10 SURGERY — OPEN REDUCTION INTERNAL FIXATION (ORIF) ANKLE FRACTURE
Anesthesia: Spinal | Site: Ankle | Laterality: Right | Wound class: Clean

## 2016-08-10 MED ORDER — PROMETHAZINE HCL 25 MG/ML IJ SOLN: 6.2500 mg | INTRAMUSCULAR | Status: DC | PRN

## 2016-08-10 MED ORDER — METHOCARBAMOL 500 MG PO TABS: 500.0000 mg | ORAL_TABLET | Freq: Four times a day (QID) | ORAL | Status: DC | PRN

## 2016-08-10 MED ORDER — METHOCARBAMOL 1000 MG/10ML IJ SOLN: 500.0000 mg | Freq: Four times a day (QID) | INTRAMUSCULAR | Status: DC | PRN

## 2016-08-10 MED ORDER — OXYCODONE HCL 5 MG PO TABS: 5.0000 mg | ORAL_TABLET | ORAL | Status: DC | PRN

## 2016-08-10 MED ORDER — MEPERIDINE HCL 25 MG/ML IJ SOLN: 6.2500 mg | INTRAMUSCULAR | Status: DC | PRN

## 2016-08-10 MED ORDER — KETAMINE HCL 50 MG/ML IJ SOLN
INTRAMUSCULAR | Status: DC | PRN
  Administered 2016-08-10: 50 mg via INTRAMUSCULAR

## 2016-08-10 MED ORDER — MIDAZOLAM HCL 5 MG/5ML IJ SOLN
INTRAMUSCULAR | Status: DC | PRN
  Administered 2016-08-10: 2 mg via INTRAVENOUS
  Administered 2016-08-10 (×2): 1 mg via INTRAVENOUS

## 2016-08-10 MED ORDER — SODIUM CHLORIDE 0.9 % IV SOLN: INTRAVENOUS | Status: DC

## 2016-08-10 MED ORDER — METOCLOPRAMIDE HCL 10 MG PO TABS
5.0000 mg | ORAL_TABLET | Freq: Three times a day (TID) | ORAL | Status: DC | PRN
Start: 2016-08-10 — End: 2016-08-10

## 2016-08-10 MED ORDER — PROPOFOL 10 MG/ML IV BOLUS
INTRAVENOUS | Status: DC | PRN
  Administered 2016-08-10: 50 mg via INTRAVENOUS
  Administered 2016-08-10: 40 mg via INTRAVENOUS

## 2016-08-10 MED ORDER — LIDOCAINE HCL (CARDIAC) 20 MG/ML IV SOLN
INTRAVENOUS | Status: DC | PRN
  Administered 2016-08-10: 30 mg via INTRAVENOUS

## 2016-08-10 MED ORDER — METOCLOPRAMIDE HCL 5 MG/ML IJ SOLN: 5.0000 mg | Freq: Three times a day (TID) | INTRAMUSCULAR | Status: DC | PRN

## 2016-08-10 MED ORDER — CEFAZOLIN SODIUM-DEXTROSE 2-4 GM/100ML-% IV SOLN: 2.0000 g | Freq: Four times a day (QID) | INTRAVENOUS | Status: DC

## 2016-08-10 MED ORDER — NEOMYCIN-POLYMYXIN B GU 40-200000 IR SOLN
Status: AC
  Filled 2016-08-10: qty 4

## 2016-08-10 MED ORDER — ACETAMINOPHEN 10 MG/ML IV SOLN
INTRAVENOUS | Status: DC | PRN
  Administered 2016-08-10: 1000 mg via INTRAVENOUS

## 2016-08-10 MED ORDER — FENTANYL CITRATE (PF) 100 MCG/2ML IJ SOLN
INTRAMUSCULAR | Status: DC | PRN
  Administered 2016-08-10 (×2): 25 ug via INTRAVENOUS
  Administered 2016-08-10: 50 ug via INTRAVENOUS

## 2016-08-10 MED ORDER — ONDANSETRON HCL 4 MG PO TABS: 4.0000 mg | ORAL_TABLET | Freq: Four times a day (QID) | ORAL | Status: DC | PRN

## 2016-08-10 MED ORDER — ONDANSETRON HCL 4 MG/2ML IJ SOLN: 4.0000 mg | Freq: Four times a day (QID) | INTRAMUSCULAR | Status: DC | PRN

## 2016-08-10 MED ORDER — PROPOFOL 500 MG/50ML IV EMUL
INTRAVENOUS | Status: DC | PRN
  Administered 2016-08-10: 75 ug/kg/min via INTRAVENOUS

## 2016-08-10 MED ORDER — BUPIVACAINE HCL (PF) 0.5 % IJ SOLN
INTRAMUSCULAR | Status: DC | PRN
  Administered 2016-08-10: 3 mL

## 2016-08-10 MED ORDER — OXYCODONE HCL 5 MG/5ML PO SOLN: 5.0000 mg | Freq: Once | ORAL | Status: DC | PRN

## 2016-08-10 MED ORDER — LACTATED RINGERS IV SOLN
INTRAVENOUS | Status: DC
  Administered 2016-08-10: 08:00:00 via INTRAVENOUS

## 2016-08-10 MED ORDER — FENTANYL CITRATE (PF) 100 MCG/2ML IJ SOLN: 25.0000 ug | INTRAMUSCULAR | Status: DC | PRN

## 2016-08-10 MED ORDER — ACETAMINOPHEN 10 MG/ML IV SOLN
INTRAVENOUS | Status: AC
  Filled 2016-08-10: qty 100

## 2016-08-10 MED ORDER — NEOMYCIN-POLYMYXIN B GU 40-200000 IR SOLN
Status: DC | PRN
  Administered 2016-08-10: 4 mL

## 2016-08-10 MED ORDER — DEXTROSE 5 % IV SOLN
3.0000 g | Freq: Once | INTRAVENOUS | Status: AC
  Administered 2016-08-10: 3 g via INTRAVENOUS
  Filled 2016-08-10: qty 3000

## 2016-08-10 MED ORDER — OXYCODONE HCL 5 MG PO TABS: 5.0000 mg | ORAL_TABLET | Freq: Once | ORAL | Status: DC | PRN

## 2016-08-10 SURGICAL SUPPLY — 60 items
BANDAGE ACE 4X5 VEL STRL LF (GAUZE/BANDAGES/DRESSINGS) ×6 IMPLANT
BIT DRILL 2.5X110 QC LCP DISP (BIT) ×3 IMPLANT
BIT DRILL 2.9 CANN QC NONSTRL (BIT) ×3 IMPLANT
BIT DRILL 2.9X70 QC CALB (BIT) ×3 IMPLANT
BLADE SURG SZ10 CARB STEEL (BLADE) ×6 IMPLANT
BNDG ESMARK 4X12 TAN STRL LF (GAUZE/BANDAGES/DRESSINGS) ×3 IMPLANT
CANISTER SUCT 1200ML W/VALVE (MISCELLANEOUS) ×3 IMPLANT
CHLORAPREP W/TINT 26ML (MISCELLANEOUS) ×3 IMPLANT
CUFF TOURN 24 STER (MISCELLANEOUS) IMPLANT
CUFF TOURN 30 STER DUAL PORT (MISCELLANEOUS) ×3 IMPLANT
DRAPE FLUOR MINI C-ARM 54X84 (DRAPES) ×3 IMPLANT
DRAPE INCISE IOBAN 66X45 STRL (DRAPES) ×3 IMPLANT
DRAPE U-SHAPE 47X51 STRL (DRAPES) ×3 IMPLANT
DRSG EMULSION OIL 3X8 NADH (GAUZE/BANDAGES/DRESSINGS) ×3 IMPLANT
ELECT CAUTERY BLADE 6.4 (BLADE) ×3 IMPLANT
ELECT REM PT RETURN 9FT ADLT (ELECTROSURGICAL) ×3
ELECTRODE REM PT RTRN 9FT ADLT (ELECTROSURGICAL) ×1 IMPLANT
GAUZE PETRO XEROFOAM 1X8 (MISCELLANEOUS) ×3 IMPLANT
GAUZE SPONGE 4X4 12PLY STRL (GAUZE/BANDAGES/DRESSINGS) ×3 IMPLANT
GLOVE BIOGEL PI IND STRL 9 (GLOVE) ×1 IMPLANT
GLOVE BIOGEL PI INDICATOR 9 (GLOVE) ×2
GLOVE INDICATOR 7.5 STRL GRN (GLOVE) ×3 IMPLANT
GLOVE SURG SYN 9.0  PF PI (GLOVE) ×2
GLOVE SURG SYN 9.0 PF PI (GLOVE) ×1 IMPLANT
GOWN SRG 2XL LVL 4 RGLN SLV (GOWNS) ×1 IMPLANT
GOWN STRL NON-REIN 2XL LVL4 (GOWNS) ×2
GOWN STRL REUS W/ TWL LRG LVL3 (GOWN DISPOSABLE) ×1 IMPLANT
GOWN STRL REUS W/TWL LRG LVL3 (GOWN DISPOSABLE) ×2
HEMOVAC 400ML (MISCELLANEOUS) ×3
K-WIRE 1.6MM THD TROCAR TIP NS (WIRE) ×3
KIT DRAIN HEMOVAC JP 7FR 400ML (MISCELLANEOUS) ×1 IMPLANT
KIT RM TURNOVER STRD PROC AR (KITS) ×3 IMPLANT
KWIRE 1.6MM THD TROCAR TIP NS (WIRE) ×1 IMPLANT
LABEL OR SOLS (LABEL) ×3 IMPLANT
NS IRRIG 1000ML POUR BTL (IV SOLUTION) ×3 IMPLANT
PACK EXTREMITY ARMC (MISCELLANEOUS) ×3 IMPLANT
PAD ABD DERMACEA PRESS 5X9 (GAUZE/BANDAGES/DRESSINGS) ×6 IMPLANT
PAD CAST CTTN 4X4 STRL (SOFTGOODS) ×2 IMPLANT
PAD PREP 24X41 OB/GYN DISP (PERSONAL CARE ITEMS) ×3 IMPLANT
PADDING CAST COTTON 4X4 STRL (SOFTGOODS) ×4
PLATE ACE 100DEG 6HOLE (Plate) ×3 IMPLANT
REPAIR TROPE KNTLS SS SYNDESMO (Orthopedic Implant) ×6 IMPLANT
SCREW ACE CAN 4.0 36M (Screw) ×3 IMPLANT
SCREW CORTICAL 3.5MM  16MM (Screw) ×2 IMPLANT
SCREW CORTICAL 3.5MM 14MM (Screw) IMPLANT
SCREW CORTICAL 3.5MM 16MM (Screw) ×1 IMPLANT
SCREW NLOCK CANC HEX 4X16 (Screw) ×9 IMPLANT
SPLINT CAST 1 STEP 5X30 WHT (MISCELLANEOUS) ×3 IMPLANT
SPONGE LAP 18X18 5 PK (GAUZE/BANDAGES/DRESSINGS) ×3 IMPLANT
STAPLER SKIN PROX 35W (STAPLE) ×3 IMPLANT
STOCKINETTE STRL 6IN 960660 (GAUZE/BANDAGES/DRESSINGS) ×3 IMPLANT
SUT ETHILON 3-0 FS-10 30 BLK (SUTURE) ×3
SUT MNCRL AB 4-0 PS2 18 (SUTURE) ×6 IMPLANT
SUT VIC AB 0 CT1 36 (SUTURE) ×3 IMPLANT
SUT VIC AB 2-0 SH 27 (SUTURE) ×4
SUT VIC AB 2-0 SH 27XBRD (SUTURE) ×2 IMPLANT
SUT VIC AB 3-0 SH 27 (SUTURE) ×2
SUT VIC AB 3-0 SH 27X BRD (SUTURE) ×1 IMPLANT
SUTURE EHLN 3-0 FS-10 30 BLK (SUTURE) ×1 IMPLANT
SYRINGE 10CC LL (SYRINGE) ×3 IMPLANT

## 2016-08-10 NOTE — H&P (Signed)
Reviewed paper H+P, will be scanned into chart. No changes noted.  

## 2016-08-10 NOTE — Anesthesia Procedure Notes (Signed)
Spinal  Patient location during procedure: OR Start time: 08/10/2016 10:04 AM End time: 08/10/2016 10:07 AM Staffing Anesthesiologist: Randa Lynn AMY Resident/CRNA: Johnna Acosta Performed: resident/CRNA  Preanesthetic Checklist Completed: patient identified, site marked, surgical consent, pre-op evaluation, timeout performed, IV checked, risks and benefits discussed and monitors and equipment checked Spinal Block Patient position: sitting Prep: ChloraPrep Patient monitoring: heart rate, continuous pulse ox, blood pressure and cardiac monitor Approach: midline Location: L4-5 Injection technique: single-shot Needle Needle type: Whitacre and Introducer  Needle gauge: 25 G Needle length: 12.7 cm Additional Notes Negative paresthesia. Negative blood return. Positive free-flowing CSF. Expiration date of kit checked and confirmed. Patient tolerated procedure well, without complications.

## 2016-08-10 NOTE — Discharge Instructions (Signed)
AMBULATORY SURGERY  DISCHARGE INSTRUCTIONS   1) The drugs that you were given will stay in your system until tomorrow so for the next 24 hours you should not:  A) Drive an automobile B) Make any legal decisions C) Drink any alcoholic beverage   2) You may resume regular meals tomorrow.  Today it is better to start with liquids and gradually work up to solid foods.  You may eat anything you prefer, but it is better to start with liquids, then soup and crackers, and gradually work up to solid foods.   3) Please notify your doctor immediately if you have any unusual bleeding, trouble breathing, redness and pain at the surgery site, drainage, fever, or pain not relieved by medication.    4) Additional Instructions:  Elevate leg on two pillow       Keep cast clean and dry Crutches with toe touch only   Please contact your physician with any problems or Same Day Surgery at [phone], Monday through Friday 6 am to 4 pm, or [place] at [facility] Main number at [phone].

## 2016-08-10 NOTE — Progress Notes (Signed)
On arrival to PACU, patient has foley catheter intact, Draining wnl, has sensation to her upper thigh Area and nothing below.  Can move hips slightly side To side.  Elevated right lower extremity on 2 pillows.

## 2016-08-10 NOTE — Progress Notes (Signed)
Patient given sprite to drink, able to move all extremities, Unable to lift her hips off the bed.

## 2016-08-10 NOTE — Anesthesia Procedure Notes (Signed)
Date/Time: 08/10/2016 10:19 AM Performed by: Ginger CarneMICHELET, Keron Neenan Pre-anesthesia Checklist: Patient identified, Emergency Drugs available, Suction available, Patient being monitored and Timeout performed Patient Re-evaluated:Patient Re-evaluated prior to inductionOxygen Delivery Method: Simple face mask

## 2016-08-10 NOTE — Op Note (Signed)
08/10/2016  12:02 PM  PATIENT:  Jacqueline QuakerAshley L Levings  35 y.o. female  PRE-OPERATIVE DIAGNOSIS:  closed trimalleolar of right ankle  POST-OPERATIVE DIAGNOSIS:  closed trimalleolar of right ankle  PROCEDURE:  Procedure(s): OPEN REDUCTION INTERNAL FIXATION (ORIF) ANKLE FRACTURE (Right)  SURGEON: Leitha SchullerMichael J Marivel Mcclarty, MD  ASSISTANTS: None  ANESTHESIA:   spinal  EBL:  Total I/O In: 1000 [I.V.:1000] Out: 305 [Urine:300; Blood:5]  BLOOD ADMINISTERED:none  DRAINS: none   LOCAL MEDICATIONS USED:  NONE  SPECIMEN:  No Specimen  DISPOSITION OF SPECIMEN:  N/A  COUNTS:  YES  TOURNIQUET:   73 minutes at 300 mmHg  IMPLANTS: Biomet third tubular plate with multiple screws, 4.0 cannulated screw 1 and medial ankle, too tight rope suture anchors  DICTATION: .Dragon Dictation patient brought the operating room and after adequate anesthesia was obtained the right leg was prepped and draped in sterile fashion with a bump underneath the right buttock to internally rotate the leg. After prepping and draping in sterile fashion tourniquet was raised lateral so incision made and the fibula exposed. There is minimally displaced with a reduction clamp holding in position, of third tubular plate was contoured and laid across the distal fibula. Proximal and distal screw holes were filled proximally with cortical screws distally with cancellus screws. The syndesmosis was obviously disrupted on stress views and the this was subsequently repaired. Going medially and inferior anterior medial approach was made and the medial malleolus was held in a reduced position with a guide were inserted followed by over drilling and placing a 34 mm partially-threaded cannulated screw the cut good compression at the fracture site. With stress views the syndesmosis was clearly still displaced and too tight rope anchors were placed that gave good fixation of the syndesmosis on stress views. Mini C-arm was used during the case to assess  alignment as well as the placement of fixation The wounds were then thoroughly irrigated and closed with 2-0 Vicryl subcutaneously followed by skin staples. Xeroform 4 x 4's web roll and a short leg cast were applied tourniquet let down at the close the case tourniquet time 73 minutes Cup medications and no specimen  PLAN OF CARE: Discharge to home after PACU  PATIENT DISPOSITION:  PACU - hemodynamically stable.

## 2016-08-10 NOTE — Anesthesia Preprocedure Evaluation (Signed)
Anesthesia Evaluation  Patient identified by MRN, date of birth, ID band Patient awake    Reviewed: Allergy & Precautions, NPO status , Patient's Chart, lab work & pertinent test results  History of Anesthesia Complications Negative for: history of anesthetic complications  Airway Mallampati: IV  TM Distance: >3 FB Neck ROM: Full  Mouth opening: Limited Mouth Opening  Dental no notable dental hx.    Pulmonary neg sleep apnea, neg COPD, former smoker,    breath sounds clear to auscultation- rhonchi (-) wheezing      Cardiovascular Exercise Tolerance: Good (-) hypertension(-) CAD and (-) Past MI  Rhythm:Regular Rate:Normal - Systolic murmurs and - Diastolic murmurs    Neuro/Psych negative neurological ROS  negative psych ROS   GI/Hepatic negative GI ROS, Neg liver ROS,   Endo/Other  negative endocrine ROSneg diabetes  Renal/GU negative Renal ROS     Musculoskeletal negative musculoskeletal ROS (+)   Abdominal (+) + obese,   Peds  Hematology negative hematology ROS (+)   Anesthesia Other Findings    Reproductive/Obstetrics                             Anesthesia Physical Anesthesia Plan  ASA: II  Anesthesia Plan: Spinal   Post-op Pain Management:    Induction:   Airway Management Planned: Natural Airway  Additional Equipment:   Intra-op Plan:   Post-operative Plan:   Informed Consent: I have reviewed the patients History and Physical, chart, labs and discussed the procedure including the risks, benefits and alternatives for the proposed anesthesia with the patient or authorized representative who has indicated his/her understanding and acceptance.   Dental advisory given  Plan Discussed with: CRNA and Anesthesiologist  Anesthesia Plan Comments:         Anesthesia Quick Evaluation

## 2016-08-10 NOTE — Transfer of Care (Signed)
Immediate Anesthesia Transfer of Care Note  Patient: Jacqueline Robinson  Procedure(s) Performed: Procedure(s): OPEN REDUCTION INTERNAL FIXATION (ORIF) ANKLE FRACTURE (Right)  Patient Location: PACU  Anesthesia Type:Spinal  Level of Consciousness: sedated  Airway & Oxygen Therapy: Patient Spontanous Breathing and Patient connected to face mask oxygen  Post-op Assessment: Report given to RN and Post -op Vital signs reviewed and stable  Post vital signs: Reviewed and stable  Last Vitals:  Vitals:   08/10/16 0751  BP: (!) 164/105  Pulse: 96  Resp: 18  Temp: 36.7 C    Last Pain:  Vitals:   08/10/16 0751  TempSrc: Oral  PainSc: 0-No pain         Complications: No apparent anesthesia complications

## 2016-08-10 NOTE — Anesthesia Postprocedure Evaluation (Signed)
Anesthesia Post Note  Patient: Jacqueline Robinson  Procedure(s) Performed: Procedure(s) (LRB): OPEN REDUCTION INTERNAL FIXATION (ORIF) ANKLE FRACTURE (Right)  Patient location during evaluation: PACU Anesthesia Type: Spinal Level of consciousness: awake and alert and oriented Pain management: pain level controlled Vital Signs Assessment: post-procedure vital signs reviewed and stable Respiratory status: spontaneous breathing, nonlabored ventilation and respiratory function stable Cardiovascular status: blood pressure returned to baseline and stable Postop Assessment: no signs of nausea or vomiting and spinal receding Anesthetic complications: no    Last Vitals:  Vitals:   08/10/16 1156 08/10/16 1157  BP: (!) 151/91 (!) 151/91  Pulse: 78 84  Resp: 17 15  Temp:  36.5 C    Last Pain:  Vitals:   08/10/16 1157  TempSrc: Tympanic  PainSc:                  Braylan Faul

## 2016-08-15 ENCOUNTER — Encounter: Payer: Self-pay | Admitting: Orthopedic Surgery

## 2016-08-22 ENCOUNTER — Inpatient Hospital Stay: Payer: 59

## 2016-08-24 DIAGNOSIS — M25571 Pain in right ankle and joints of right foot: Secondary | ICD-10-CM | POA: Diagnosis not present

## 2016-09-06 DIAGNOSIS — Z967 Presence of other bone and tendon implants: Secondary | ICD-10-CM | POA: Diagnosis not present

## 2016-09-15 DIAGNOSIS — Z8781 Personal history of (healed) traumatic fracture: Secondary | ICD-10-CM | POA: Diagnosis not present

## 2016-09-15 DIAGNOSIS — Z967 Presence of other bone and tendon implants: Secondary | ICD-10-CM | POA: Diagnosis not present

## 2016-09-15 DIAGNOSIS — S82851D Displaced trimalleolar fracture of right lower leg, subsequent encounter for closed fracture with routine healing: Secondary | ICD-10-CM | POA: Diagnosis not present

## 2016-10-18 ENCOUNTER — Ambulatory Visit: Payer: 59 | Admitting: Hematology and Oncology

## 2016-10-18 ENCOUNTER — Other Ambulatory Visit: Payer: 59

## 2016-10-19 ENCOUNTER — Ambulatory Visit: Payer: 59 | Admitting: Oncology

## 2016-10-19 ENCOUNTER — Other Ambulatory Visit: Payer: 59

## 2016-11-08 DIAGNOSIS — Z967 Presence of other bone and tendon implants: Secondary | ICD-10-CM | POA: Diagnosis not present

## 2016-11-08 DIAGNOSIS — Z8781 Personal history of (healed) traumatic fracture: Secondary | ICD-10-CM | POA: Diagnosis not present

## 2016-11-10 DIAGNOSIS — D72829 Elevated white blood cell count, unspecified: Secondary | ICD-10-CM | POA: Diagnosis not present

## 2016-11-10 DIAGNOSIS — D72828 Other elevated white blood cell count: Secondary | ICD-10-CM | POA: Diagnosis not present

## 2017-05-07 ENCOUNTER — Other Ambulatory Visit: Payer: Self-pay | Admitting: Internal Medicine

## 2017-05-07 DIAGNOSIS — R1310 Dysphagia, unspecified: Secondary | ICD-10-CM

## 2017-05-11 ENCOUNTER — Ambulatory Visit
Admission: RE | Admit: 2017-05-11 | Discharge: 2017-05-11 | Disposition: A | Discharge status: Home / Self Care | Payer: 59 | Source: Ambulatory Visit | Attending: Internal Medicine | Admitting: Internal Medicine

## 2017-05-11 DIAGNOSIS — R1312 Dysphagia, oropharyngeal phase: Secondary | ICD-10-CM | POA: Insufficient documentation

## 2017-05-11 DIAGNOSIS — R1310 Dysphagia, unspecified: Secondary | ICD-10-CM

## 2017-05-11 MED ORDER — IOPAMIDOL (ISOVUE-300) INJECTION 61%
75.0000 mL | Freq: Once | INTRAVENOUS | Status: AC | PRN
  Administered 2017-05-11: 75 mL via INTRAVENOUS

## 2017-11-06 ENCOUNTER — Encounter: Payer: Self-pay | Admitting: Podiatry

## 2017-11-06 ENCOUNTER — Ambulatory Visit (INDEPENDENT_AMBULATORY_CARE_PROVIDER_SITE_OTHER): Payer: 59 | Admitting: Podiatry

## 2017-11-06 VITALS — BP 162/90 | HR 97 | Resp 16

## 2017-11-06 DIAGNOSIS — L600 Ingrowing nail: Secondary | ICD-10-CM | POA: Diagnosis not present

## 2017-11-06 MED ORDER — GENTAMICIN SULFATE 0.1 % EX CREA: 1.0000 "application " | TOPICAL_CREAM | Freq: Three times a day (TID) | CUTANEOUS | 1 refills | Status: DC

## 2017-11-06 NOTE — Progress Notes (Signed)
   Subjective:    Patient ID: Jacqueline Robinson, female    DOB: 1981-07-02, 37 y.o.   MRN: [id number]  HPI Chief Complaint  Patient presents with  . Toe Pain    Hallux bilateral - both borders, tender intermittently x years, soaking and cleaning with betadine      Review of Systems  HENT: Positive for ear pain.   Gastrointestinal: Positive for diarrhea.       Objective:   Physical Exam        Assessment & Plan:

## 2017-11-06 NOTE — Patient Instructions (Signed)

## 2017-11-07 NOTE — Progress Notes (Signed)
   Subjective: Patient presents today for evaluation of intermittent pain to the medial and lateral borders of the bilateral great toes that has been ongoing for the past few years. She reports associated swelling and erythema to the areas. Patient is concerned for possible ingrown nail. Applying pressure to the borders of the toes increases the pain. There are no alleviating factors noted. She has been soaking the toes and cleaning them with Betadine with no significant relief. Patient presents today for further treatment and evaluation.   Past Medical History:  Diagnosis Date  . Back pain   . Strep throat     Objective:  General: Well developed, nourished, in no acute distress, alert and oriented x3   Dermatology: Skin is warm, dry and supple bilateral. Medial and lateral borders of bilateral great toes appear to be erythematous with evidence of an ingrowing nails. Pain on palpation noted to the border of the nail fold. The remaining nails appear unremarkable at this time. There are no open sores, lesions.  Vascular: Dorsalis Pedis artery and Posterior Tibial artery pedal pulses palpable. No lower extremity edema noted.   Neruologic: Grossly intact via light touch bilateral.  Musculoskeletal: Muscular strength within normal limits in all groups bilateral. Normal range of motion noted to all pedal and ankle joints.   Assesement: #1 Paronychia with ingrowing nail medial and lateral borders of bilateral great toes #2 Pain in toe #3 Incurvated nail  Plan of Care:  1. Patient evaluated.  2. Discussed treatment alternatives and plan of care. Explained nail avulsion procedure and post procedure course to patient. 3. Patient opted for permanent partial nail avulsion.  4. Prior to procedure, local anesthesia infiltration utilized using 3 ml of a 50:50 mixture of 2% plain lidocaine and 0.5% plain marcaine in a normal hallux block fashion and a betadine prep performed.  5. Partial permanent  nail avulsion with chemical matrixectomy performed using 3x30sec applications of phenol followed by alcohol flush.  6. Light dressing applied. 7. Prescription for gentamicin cream provided to patient. 8. Return to clinic in 2 weeks.   Felecia ShellingBrent M. Donicia Druck, DPM [facility]  Dr. Felecia ShellingBrent M. Dash Cardarelli, DPM    [address]                                        [place], [place] [address]                Office [phone]  Fax [phone]

## 2017-11-14 DIAGNOSIS — H5213 Myopia, bilateral: Secondary | ICD-10-CM | POA: Diagnosis not present

## 2017-11-14 DIAGNOSIS — H52223 Regular astigmatism, bilateral: Secondary | ICD-10-CM | POA: Diagnosis not present

## 2017-11-20 ENCOUNTER — Ambulatory Visit: Payer: 59 | Admitting: Podiatry

## 2017-11-27 ENCOUNTER — Encounter: Payer: Self-pay | Admitting: Podiatry

## 2017-11-27 ENCOUNTER — Ambulatory Visit (INDEPENDENT_AMBULATORY_CARE_PROVIDER_SITE_OTHER): Payer: 59 | Admitting: Podiatry

## 2017-11-27 DIAGNOSIS — L600 Ingrowing nail: Secondary | ICD-10-CM

## 2017-11-28 NOTE — Progress Notes (Signed)
   Subjective: Patient presents today 2 weeks post ingrown nail permanent nail avulsion procedure to the medial and lateral borders of the bilateral great toes. She states the left great toe has healed well but reports some erythema and swelling of the right great toe. She reports associated drainage from the medial border. She has been applying gentamicin cream as directed. Patient is here for further evaluation and treatment.   Past Medical History:  Diagnosis Date  . Back pain   . Strep throat     Objective: Skin is warm, dry and supple. Nail and respective nail fold appears to be healing appropriately. Open wound to the associated nail fold with a granular wound base and moderate amount of fibrotic tissue. Minimal drainage noted. Mild erythema around the periungual region likely due to phenol chemical matricectomy.  Assessment: #1 postop permanent partial nail avulsion medial and lateral borders of bilateral great toes #2 open wound periungual nail fold of respective digit.   Plan of care: #1 patient was evaluated  #2 debridement of open wound was performed to the periungual border of the respective toe using a currette. Antibiotic ointment and Band-Aid was applied. #3 Continue applying gentamicin cream daily with a Band-Aid for one week.  #4 patient is to return to clinic on a PRN basis.   Felecia ShellingBrent M. Evans, DPM [facility]  Dr. Felecia ShellingBrent M. Evans, DPM    [address]                                        [place], [place] [address]                Office [phone]  Fax [phone]

## 2018-03-19 DIAGNOSIS — Z0000 Encounter for general adult medical examination without abnormal findings: Secondary | ICD-10-CM | POA: Diagnosis not present

## 2018-04-09 ENCOUNTER — Other Ambulatory Visit: Payer: Self-pay | Admitting: Internal Medicine

## 2018-04-09 ENCOUNTER — Ambulatory Visit
Admission: RE | Admit: 2018-04-09 | Discharge: 2018-04-09 | Disposition: A | Discharge status: Home / Self Care | Payer: 59 | Source: Ambulatory Visit | Attending: Internal Medicine | Admitting: Internal Medicine

## 2018-04-09 DIAGNOSIS — S99912A Unspecified injury of left ankle, initial encounter: Secondary | ICD-10-CM | POA: Diagnosis not present

## 2018-04-09 DIAGNOSIS — S8992XA Unspecified injury of left lower leg, initial encounter: Secondary | ICD-10-CM | POA: Diagnosis not present

## 2018-04-09 DIAGNOSIS — R52 Pain, unspecified: Secondary | ICD-10-CM

## 2018-04-09 DIAGNOSIS — M79606 Pain in leg, unspecified: Secondary | ICD-10-CM | POA: Diagnosis present

## 2018-04-09 DIAGNOSIS — S99922A Unspecified injury of left foot, initial encounter: Secondary | ICD-10-CM | POA: Diagnosis not present

## 2018-04-09 DIAGNOSIS — M79672 Pain in left foot: Secondary | ICD-10-CM | POA: Diagnosis not present

## 2018-04-09 DIAGNOSIS — M7732 Calcaneal spur, left foot: Secondary | ICD-10-CM | POA: Diagnosis not present

## 2018-04-09 DIAGNOSIS — M79605 Pain in left leg: Secondary | ICD-10-CM | POA: Insufficient documentation

## 2018-04-09 DIAGNOSIS — M79673 Pain in unspecified foot: Secondary | ICD-10-CM | POA: Diagnosis present

## 2018-04-09 DIAGNOSIS — M79662 Pain in left lower leg: Secondary | ICD-10-CM | POA: Diagnosis not present

## 2018-04-09 DIAGNOSIS — M25572 Pain in left ankle and joints of left foot: Secondary | ICD-10-CM | POA: Diagnosis not present

## 2018-09-19 ENCOUNTER — Inpatient Hospital Stay: Payer: 59 | Admitting: Oncology

## 2018-09-20 ENCOUNTER — Telehealth: Payer: Self-pay | Admitting: Oncology

## 2018-09-20 NOTE — Telephone Encounter (Signed)
Patient was scheduled multiple times within the past 2 years and have not showed up for appointments including last  NP appt, which was confirmed w patient each  Time.  Patient will not be rescheduled

## 2018-10-23 ENCOUNTER — Ambulatory Visit
Admission: RE | Admit: 2018-10-23 | Discharge: 2018-10-23 | Disposition: A | Discharge status: Home / Self Care | Payer: 59 | Attending: Internal Medicine | Admitting: Internal Medicine

## 2018-10-23 ENCOUNTER — Ambulatory Visit
Admission: RE | Admit: 2018-10-23 | Discharge: 2018-10-23 | Disposition: A | Discharge status: Home / Self Care | Payer: 59 | Source: Ambulatory Visit | Attending: Internal Medicine | Admitting: Internal Medicine

## 2018-10-23 ENCOUNTER — Other Ambulatory Visit: Payer: Self-pay | Admitting: Internal Medicine

## 2018-10-23 DIAGNOSIS — R52 Pain, unspecified: Secondary | ICD-10-CM

## 2018-10-23 DIAGNOSIS — M549 Dorsalgia, unspecified: Secondary | ICD-10-CM | POA: Diagnosis not present

## 2018-10-23 DIAGNOSIS — R079 Chest pain, unspecified: Secondary | ICD-10-CM

## 2019-03-26 DIAGNOSIS — H52223 Regular astigmatism, bilateral: Secondary | ICD-10-CM | POA: Diagnosis not present

## 2019-03-26 DIAGNOSIS — H5213 Myopia, bilateral: Secondary | ICD-10-CM | POA: Diagnosis not present

## 2019-04-23 DIAGNOSIS — Z0000 Encounter for general adult medical examination without abnormal findings: Secondary | ICD-10-CM | POA: Diagnosis not present

## 2020-04-08 DIAGNOSIS — H5213 Myopia, bilateral: Secondary | ICD-10-CM | POA: Diagnosis not present

## 2020-04-19 ENCOUNTER — Other Ambulatory Visit (INDEPENDENT_AMBULATORY_CARE_PROVIDER_SITE_OTHER): Payer: 59

## 2020-04-19 DIAGNOSIS — D72829 Elevated white blood cell count, unspecified: Secondary | ICD-10-CM

## 2020-04-19 LAB — CBC WITH DIFFERENTIAL/PLATELET
Absolute Monocytes: 923 cells/uL (ref 200–950)
Basophils Absolute: 81 cells/uL (ref 0–200)
Basophils Relative: 0.5 %
Eosinophils Absolute: 243 cells/uL (ref 15–500)
Eosinophils Relative: 1.5 %
HCT: 41.7 % (ref 35.0–45.0)
Hemoglobin: 13.9 g/dL (ref 11.7–15.5)
Lymphs Abs: 3548 cells/uL (ref 850–3900)
MCH: 27.9 pg (ref 27.0–33.0)
MCHC: 33.3 g/dL (ref 32.0–36.0)
MCV: 83.6 fL (ref 80.0–100.0)
MPV: 9.6 fL (ref 7.5–12.5)
Monocytes Relative: 5.7 %
Neutro Abs: 11405 cells/uL — ABNORMAL HIGH (ref 1500–7800)
Neutrophils Relative %: 70.4 %
Platelets: 407 10*3/uL — ABNORMAL HIGH (ref 140–400)
RBC: 4.99 10*6/uL (ref 3.80–5.10)
RDW: 14 % (ref 11.0–15.0)
Total Lymphocyte: 21.9 %
WBC: 16.2 10*3/uL — ABNORMAL HIGH (ref 3.8–10.8)

## 2020-05-28 ENCOUNTER — Other Ambulatory Visit: Payer: Self-pay

## 2020-05-28 DIAGNOSIS — N61 Mastitis without abscess: Secondary | ICD-10-CM

## 2020-06-07 ENCOUNTER — Ambulatory Visit
Admission: RE | Admit: 2020-06-07 | Discharge: 2020-06-07 | Disposition: A | Discharge status: Home / Self Care | Payer: 59 | Source: Ambulatory Visit | Attending: Internal Medicine | Admitting: Internal Medicine

## 2020-06-07 ENCOUNTER — Other Ambulatory Visit: Payer: Self-pay

## 2020-06-07 ENCOUNTER — Other Ambulatory Visit: Payer: Self-pay | Admitting: Internal Medicine

## 2020-06-07 DIAGNOSIS — N6489 Other specified disorders of breast: Secondary | ICD-10-CM | POA: Insufficient documentation

## 2020-06-07 DIAGNOSIS — N61 Mastitis without abscess: Secondary | ICD-10-CM

## 2020-06-07 DIAGNOSIS — R928 Other abnormal and inconclusive findings on diagnostic imaging of breast: Secondary | ICD-10-CM | POA: Diagnosis not present

## 2020-06-30 ENCOUNTER — Encounter: Payer: Self-pay | Admitting: Plastic Surgery

## 2020-06-30 ENCOUNTER — Other Ambulatory Visit: Payer: Self-pay

## 2020-06-30 ENCOUNTER — Ambulatory Visit (INDEPENDENT_AMBULATORY_CARE_PROVIDER_SITE_OTHER): Payer: 59 | Admitting: Plastic Surgery

## 2020-06-30 VITALS — BP 165/115 | HR 95 | Temp 98.0°F | Ht 66.0 in | Wt 319.0 lb

## 2020-06-30 DIAGNOSIS — N6002 Solitary cyst of left breast: Secondary | ICD-10-CM | POA: Diagnosis not present

## 2020-06-30 NOTE — Progress Notes (Signed)
   Referring Provider Corky Downs, MD [address] [place],  [place] [address]   CC: No chief complaint on file.     Jacqueline Robinson is an 39 y.o. female.  HPI: Patient presents with a nodule on the left breast.  She was able to palpate it herself.  She wants me to look at it as she is concerned about it being close to the lymph node.  She has had it imaged and no obvious nodule was located on mammography or targeted ultrasound.  She is trying to decide how aggressively to pursue treatment.  She has not had any previous breast procedures or biopsies.  No Known Allergies  Outpatient Encounter Medications as of 06/30/2020  Medication Sig  . gentamicin cream (GARAMYCIN) 0.1 % Apply 1 application topically 3 (three) times daily.   No facility-administered encounter medications on file as of 06/30/2020.     Past Medical History:  Diagnosis Date  . Back pain   . Strep throat     Past Surgical History:  Procedure Laterality Date  . CESAREAN SECTION N/A   . ORIF ANKLE FRACTURE Right 08/10/2016   Procedure: OPEN REDUCTION INTERNAL FIXATION (ORIF) ANKLE FRACTURE;  Surgeon: Kennedy Bucker, MD;  Location: [facility] ORS;  Service: Orthopedics;  Laterality: Right;  . TUBAL LIGATION      Family History  Problem Relation Age of Onset  . Breast cancer Maternal Aunt 35  . Breast cancer Paternal Grandmother 18    Social History   Social History Narrative  . Not on file     Review of Systems General: Denies fevers, chills, weight loss CV: Denies chest pain, shortness of breath, palpitations  Physical Exam Vitals with BMI 06/30/2020 08/10/2016 08/10/2016  Height 5\' 6"  - -  Weight 319 lbs - -  BMI 51.51 - -  Systolic 165 144  Diastolic 115 90 92  Pulse 95 97 81  Some encounter information is confidential and restricted. Go to Review Flowsheets activity to see all data.    General:  No acute distress,  Alert and oriented, Non-Toxic, Normal speech and affect Examination shows a  palpable nodule in the upper outer quadrant of the left breast.  It is mobile with no overlying skin changes.  It feels smooth and round.  She just a few centimeters beneath the surface.  I do not detect any other obvious masses or axillary lymphadenopathy.  Assessment/Plan Patient has a smooth palpable cystic lesion in the upper outer quadrant of the left breast.  We had a long discussion about how to proceed.  I explained that imaging is usually pretty good at detecting worrisome masses and if nothing was identified a mammography or ultrasound of significance and I felt that it would be safe to continue to watch this.  I also did offer to excise it for her under local if she reached the point where she felt the need to have it out.  I discussed the details of that procedure with her.  At the moment she is content watching this and has follow-up imaging scheduled for a few months from now.  I explained if it changes and she would like to be evaluated sooner than I had be happy to do that.  All of her questions were answered.  I will plan to see her again on an as-needed basis.  371 06/30/2020, 1:00 PM

## 2020-07-14 ENCOUNTER — Other Ambulatory Visit: Payer: Self-pay | Admitting: Internal Medicine

## 2020-07-14 MED ORDER — DOXYCYCLINE HYCLATE 100 MG PO TABS: 100.0000 mg | ORAL_TABLET | Freq: Two times a day (BID) | ORAL | 0 refills | Status: DC

## 2020-07-26 ENCOUNTER — Other Ambulatory Visit: Payer: Self-pay | Admitting: Internal Medicine

## 2020-07-26 MED ORDER — GENTAMICIN SULFATE 0.1 % EX CREA
1.0000 | TOPICAL_CREAM | Freq: Three times a day (TID) | CUTANEOUS | 1 refills | Status: DC
Start: 2020-07-26 — End: 2021-08-04

## 2020-07-26 MED ORDER — DOXYCYCLINE HYCLATE 100 MG PO TABS
100.0000 mg | ORAL_TABLET | Freq: Two times a day (BID) | ORAL | 0 refills | Status: DC
Start: 2020-07-26 — End: 2020-08-11

## 2020-08-02 ENCOUNTER — Other Ambulatory Visit (INDEPENDENT_AMBULATORY_CARE_PROVIDER_SITE_OTHER): Payer: 59

## 2020-08-02 DIAGNOSIS — Z1152 Encounter for screening for COVID-19: Secondary | ICD-10-CM | POA: Diagnosis not present

## 2020-08-02 LAB — POC COVID19 BINAXNOW: SARS Coronavirus 2 Ag: NEGATIVE

## 2020-08-08 NOTE — Progress Notes (Signed)
   Established Patient Office Visit  Subjective:  Patient ID: Jacqueline Robinson, female    DOB: [birth year]  Age: 39 y.o. MRN: [id number]  CC:  Chief Complaint  Patient presents with  . Advice Only  . Skin Problem    HPI Jacqueline Robinson presents for skin care consultation. Her primary concern is acne scarring. Secondary concern is some areas of redness. Reports she has oily skin and washes her face twice daily with Panoxyl. She uses no other skin care products regularly. She rarely uses makeup as she feels it accentuates the acne scarring. She does not currently use any kind of exfoliant. Reports she rarely has acne at this stage in life except a few breakouts around her menstrual cycle.    ROS Review of Systems  Constitutional: Negative for chills and fever.  HENT: Negative for congestion, rhinorrhea, sneezing and sore throat.   Respiratory: Negative for cough.   Gastrointestinal: Negative for nausea and vomiting.  Skin: Positive for color change (redness). Negative for pallor and rash.       Acne scarring on forehead and cheeks      Objective:    Physical Exam Vitals and nursing note reviewed.  Constitutional:      General: She is not in acute distress.    Appearance: Normal appearance. She is obese. She is not ill-appearing.  HENT:     Head: Normocephalic and atraumatic.  Pulmonary:     Effort: Pulmonary effort is normal.  Skin:    General: Skin is warm and dry.     Comments: Significant pitted acne scaring on forehead, cheeks, and chin. Redness scattered across face with a patch to the right of her mouth. A few closed comedones present.  Neurological:     Mental Status: She is alert and oriented to person, place, and time.  Psychiatric:        Mood and Affect: Mood normal.        Behavior: Behavior normal.        Thought Content: Thought content normal.        Judgment: Judgment normal.                 Pulse 100   Temp 98.6 F (37 C) (Oral)   Ht _0   (1.702 m)   Wt (!) 305 lb 9.6 oz (138.6 kg)   LMP 07/12/2020 (Approximate)   SpO2 98%   BMI 47.86 kg/m     Assessment & Plan:   Patients primary focus is the acne scaring with secondary focus on redness. She has oily skin and minimal skin care routine. I agree with these priorities and also want to continue to control oil production. Current acne scarring is more significant that what can be remedied with skin care products alone. Recommend combination of microneedling (3-6 treatments) and skin care products for improved skin cell turnover to address the acne scarring. Recommend also adding skin care products to address redness/inflammation and oil control.  Recommend the following kit to start skin care program ZO Skin Normalizing System kit  (includes the following products): 1) Gentle Cleanser 2) Exfoliating Polish 3) Oil control Acne treatment Pads 4) Daily Power Defense 5) Rozatrol  Patient is in agreement with plan. She has scheduled her first microneedling treatment and ordered the ZO Skin Normalizing System Kit. Follow up in 6 weeks. Call office with any questions/concerns.   Threasa Heads, PA-C

## 2020-08-11 ENCOUNTER — Other Ambulatory Visit: Payer: Self-pay

## 2020-08-11 MED ORDER — DOXYCYCLINE HYCLATE 100 MG PO TABS
100.0000 mg | ORAL_TABLET | Freq: Two times a day (BID) | ORAL | 2 refills | Status: DC
Start: 2020-08-11 — End: 2020-10-22

## 2020-08-12 ENCOUNTER — Other Ambulatory Visit: Payer: Self-pay

## 2020-08-12 ENCOUNTER — Encounter: Payer: Self-pay | Admitting: Plastic Surgery

## 2020-08-12 ENCOUNTER — Ambulatory Visit (INDEPENDENT_AMBULATORY_CARE_PROVIDER_SITE_OTHER): Payer: Self-pay | Admitting: Plastic Surgery

## 2020-08-12 VITALS — HR 100 | Temp 98.6°F | Ht 67.0 in | Wt 305.6 lb

## 2020-08-12 DIAGNOSIS — Z719 Counseling, unspecified: Secondary | ICD-10-CM

## 2020-08-13 ENCOUNTER — Encounter: Payer: Self-pay | Admitting: Plastic Surgery

## 2020-09-09 ENCOUNTER — Ambulatory Visit: Payer: 59 | Admitting: Plastic Surgery

## 2020-09-15 ENCOUNTER — Ambulatory Visit: Payer: 59 | Admitting: Plastic Surgery

## 2020-09-16 DIAGNOSIS — Z719 Counseling, unspecified: Secondary | ICD-10-CM

## 2020-09-27 ENCOUNTER — Ambulatory Visit (INDEPENDENT_AMBULATORY_CARE_PROVIDER_SITE_OTHER): Payer: 59

## 2020-09-27 ENCOUNTER — Other Ambulatory Visit: Payer: Self-pay

## 2020-09-27 DIAGNOSIS — Z20822 Contact with and (suspected) exposure to covid-19: Secondary | ICD-10-CM

## 2020-09-27 LAB — POC COVID19 BINAXNOW: SARS Coronavirus 2 Ag: POSITIVE — AB

## 2020-09-27 NOTE — Progress Notes (Signed)
po

## 2020-10-06 ENCOUNTER — Other Ambulatory Visit: Payer: Self-pay

## 2020-10-06 ENCOUNTER — Other Ambulatory Visit: Payer: 59

## 2020-10-06 DIAGNOSIS — Z20822 Contact with and (suspected) exposure to covid-19: Secondary | ICD-10-CM

## 2020-10-07 LAB — NOVEL CORONAVIRUS, NAA: SARS-CoV-2, NAA: DETECTED — AB

## 2020-10-07 LAB — SARS-COV-2, NAA 2 DAY TAT

## 2020-10-18 ENCOUNTER — Other Ambulatory Visit: Payer: Self-pay

## 2020-10-21 NOTE — Progress Notes (Signed)
Subjective:     Patient ID: Jacqueline Robinson, female    DOB: 1981-05-18, 40 y.o.   MRN: [id number]  Chief Complaint  Patient presents with  . Follow-up    HPI: The patient is a 40 y.o. female here for follow-up from her skin care consultation on 08/12/2020.  Her primary concern is acne scarring.  Secondary concern is some areas of redness.  She has oily skin and occasional acne (mostly around her menstrual cycle).  She was previously washing her face with PanOxyl twice daily.  She had significant pitted acne scarring on her forehead, cheeks, and chin.  Redness scattered across face with a patch to the right of her mouth. Significant uneven texture, And a few closed comedones were present at last visit.   Recommendation to increase exfoliation to improve texture of skin; rozatrol to decrease overall redness; and microneedling to address deep acne scaring.  She started utilizing ZO complexion clearing program kit December 23 which includes: Exfoliating Cleanser - twice daily, Exfoliating [place] - once daily, Complexion Renewal Pads - twice daily, Complexion Clearing Masque - 2-3 times a week. She was also started on ZO Rozatrol twice daily. Also, recommended microneedling - she will most likely need 2-3 sessions due to the depth of the acne scarring.   Today she reports she is pleased with her current skin care regimen. Reports improvement in skin texture and reduction of redness. Reports no increase in acne, slight decrease (she didn't have frequent breakouts). She had to delay starting microneedling to recent COVID + diagnosis.  Review of Systems  Constitutional: Negative for chills and fever.  HENT: Negative for sneezing and sore throat.   Respiratory: Negative for cough and shortness of breath.   Cardiovascular: Negative for chest pain.  Gastrointestinal: Negative for nausea and vomiting.  Skin: Positive for color change (reduction in overall redness). Negative for pallor and rash.   Neurological: Negative for headaches.     Objective:   Vital Signs BP (!) 160/93 (BP Location: Left Arm, Patient Position: Sitting, Cuff Size: Large)   Pulse 96   LMP 10/21/2020 (Exact Date)   SpO2 98%  Vital Signs and Nursing Note Reviewed  Physical Exam Constitutional:      General: She is not in acute distress.    Appearance: Normal appearance. She is normal weight. She is not ill-appearing.  HENT:     Head: Normocephalic and atraumatic.  Eyes:     Extraocular Movements: Extraocular movements intact and EOM normal.  Pulmonary:     Effort: Pulmonary effort is normal.  Musculoskeletal:        General: Normal range of motion.     Cervical back: Normal range of motion.  Skin:    General: Skin is warm and dry.     Coloration: Skin is not pale.     Findings: No rash.     Comments: Good overall improvement in facial skin texture and redness. Red patch remains to right of mouth, slight improvement. Pitted acne scarring remains present.  Neurological:     Mental Status: She is alert and oriented to person, place, and time.     Gait: Gait is intact.  Psychiatric:        Mood and Affect: Mood and affect normal.        Behavior: Behavior normal.        Thought Content: Thought content normal.        Cognition and Memory: Memory normal.  Judgment: Judgment normal.        Pictures were obtained of the patient and placed in the chart with the patient's or guardian's permission.    Assessment/Plan:     ICD-10-CM   1. Encounter for counseling  Z71.9   2. Acne scarring  L73.0   3. Acne, unspecified acne type  L70.9    [name] is pleased with her results so far from the ZO skin care products. She has been using her products for ~4 weeks. Full results should be observed at approximately 7 weeks.  She still would like to address the pitted acne scaring; this will be addressed by the microneedling. She is scheduled for her first microneedling session February 21. She  will most likely need 2-3 microneedling session due to the depth of the acne scarring.   Recommend 2-3 microneedling sessions. Continue with current ZO regimen. May consider adding daily power defense to current regimen for additional improvment. Follow up after first microneedling session. Call office with any questions/concern.  Johanna C Young, PA-C 10/22/2020, 11:41 AM     

## 2020-10-22 ENCOUNTER — Ambulatory Visit (INDEPENDENT_AMBULATORY_CARE_PROVIDER_SITE_OTHER): Payer: 59 | Admitting: Plastic Surgery

## 2020-10-22 ENCOUNTER — Encounter: Payer: Self-pay | Admitting: Plastic Surgery

## 2020-10-22 ENCOUNTER — Other Ambulatory Visit: Payer: Self-pay

## 2020-10-22 VITALS — BP 160/93 | HR 96

## 2020-10-22 DIAGNOSIS — L709 Acne, unspecified: Secondary | ICD-10-CM

## 2020-10-22 DIAGNOSIS — L730 Acne keloid: Secondary | ICD-10-CM

## 2020-10-22 DIAGNOSIS — Z719 Counseling, unspecified: Secondary | ICD-10-CM

## 2020-11-15 ENCOUNTER — Ambulatory Visit (INDEPENDENT_AMBULATORY_CARE_PROVIDER_SITE_OTHER): Payer: Self-pay

## 2020-11-15 ENCOUNTER — Other Ambulatory Visit: Payer: Self-pay

## 2020-11-15 VITALS — BP 132/84 | HR 80 | Temp 98.4°F | Ht 67.0 in | Wt 305.0 lb

## 2020-11-15 DIAGNOSIS — Z719 Counseling, unspecified: Secondary | ICD-10-CM

## 2020-11-16 NOTE — Progress Notes (Unsigned)
Microneedling procedure for face  PREOPERATIVE DIAGNOSIS:  Acne scarring- face   POSTOPERATIVE DIAGNOSIS: same  PROCEDURES:  Micro-needling procedure for face.  ATTENDING SURGEON:  Dr. Arita Miss  ANESTHESIA:  EMLA applied 30 mins prior to procedure  COMPLICATIONS: None.  JUSTIFICATION FOR PROCEDURE:  Ms. Mallery is a 40 y.o. female with a history of acne scarring to face.  The patient present for Micro needling procedure to face.  Risks, benefits, indications, and alternatives of the above described. Procedure was discussed with the patient and all the patient's questions were answered.   DESCRIPTION OF PROCEDURE: After informed consent was obtained and proper identification of patient and surgical site was made, the patient was taken to the procedure room and pre-procedure photos taken & entered into chart. Patient was then placed supine on the operating room table. A time out was performed to confirm patient's identity and surgical site. The patient was prepped and draped in the usual sterile fashion.   Using a # SM 12-point REVO needle ( lot# [id number]/ exp 05/2022) on the [employer] Digital machine micro-needling procedure was performed to her entire face. Setting for the machine= speed/2 & depth/1.5- 2 I used ZO Growth Factor Serum for glide/injection during the procedure. Post-care: Neocutis "Aftercare/Post-treatment Soothing Cream" applied to face after treatment.    The patient tolerated the procedure well.  We discussed after care instructions.

## 2020-11-16 NOTE — Patient Instructions (Signed)
Pt understands that her face will be red/irritated & may experience dryness/peeling. She will use moisturizer/sunscreen. She will avoid sun/retinol products & no harsh scrubs/cleansers for approx 1 week. We discussed the need to wait at least 6 weeks for the treatment results & the possibility of needing multiple sessions. She will call for any questions or concerns- otherwise she will f/u in 6 weeks for another treatment.

## 2020-12-06 DIAGNOSIS — L918 Other hypertrophic disorders of the skin: Secondary | ICD-10-CM | POA: Diagnosis not present

## 2021-01-17 ENCOUNTER — Other Ambulatory Visit: Payer: Self-pay

## 2021-01-17 ENCOUNTER — Ambulatory Visit (INDEPENDENT_AMBULATORY_CARE_PROVIDER_SITE_OTHER): Payer: Self-pay

## 2021-01-17 VITALS — BP 127/76 | HR 75 | Temp 98.4°F | Ht 63.0 in | Wt 300.0 lb

## 2021-01-17 DIAGNOSIS — Z719 Counseling, unspecified: Secondary | ICD-10-CM

## 2021-01-19 ENCOUNTER — Other Ambulatory Visit: Payer: Self-pay

## 2021-01-19 MED ORDER — SPIRONOLACTONE 25 MG PO TABS: 25.0000 mg | ORAL_TABLET | Freq: Every day | ORAL | 1 refills | Status: DC

## 2021-02-02 NOTE — Progress Notes (Signed)
01/17/21- patient here today for 2nd Micro-needling procedure for facial acne scars EMLA was applied by pt 30 minutes prior to procedure. Risks, benefits & other options were discussed & she consented to have this procedure performed. Face was cleaned with alcohol & dried. Micro-needling was performed to her entire face using # 20 needle on the [employer] machine. I used ZO growth factor & ZO firming serum during the procedure for "glide" & for instillation. She tolerated the treatment well & I reviewed the post-care instructions with her. She will call for any concerns

## 2021-02-02 NOTE — Patient Instructions (Signed)
Pt is instructed to avoid sun/use sunscreen & use moisturizer as needed She will avoid retinol products or harsh scrubs for approx 7 days She understands that skin may be red/irritated & may peel. She will call for any concerns or questions. F/U in 6-8 weeks for assessment for scars & we will discuss possible further options.

## 2021-02-14 ENCOUNTER — Other Ambulatory Visit: Payer: Self-pay | Admitting: Internal Medicine

## 2021-02-14 MED ORDER — SPIRONOLACTONE 50 MG PO TABS: 50.0000 mg | ORAL_TABLET | Freq: Every day | ORAL | 6 refills | Status: DC

## 2021-03-02 ENCOUNTER — Ambulatory Visit (INDEPENDENT_AMBULATORY_CARE_PROVIDER_SITE_OTHER): Payer: Self-pay

## 2021-03-02 ENCOUNTER — Other Ambulatory Visit: Payer: Self-pay

## 2021-03-02 VITALS — BP 125/78 | HR 84 | Temp 98.5°F | Ht 67.0 in | Wt 297.0 lb

## 2021-03-02 DIAGNOSIS — Z719 Counseling, unspecified: Secondary | ICD-10-CM

## 2021-03-07 NOTE — Progress Notes (Signed)
03/02/21 Patient is here today for status update on post- micro-needling procedures (x2)  for bilateral mid face acne scarring. Upon exam & review of previous photos the scars have decreased - but are still visible & have a depth of approximately 0.3cm. We discussed other options to help resurface the skin-to minimize the scars. I had previously consulted with Dr. Arita Miss about using micro-derm abrasion as well as other treatments.  He agrees to offer multiple modalities of treatment.  Patient would like to have a "test" area for the micro-derm.  She chose left mid cheek area, which she feels has the most scarring. Topical EMLA cream had been applied 30 mins prior to procedure. Cleaned face with facial cleanser & then alcohol prep. Using the small diamond burr on the Acrotorque II system- I performed micro-derm abrasion on left cheek area. Speed/intensity = 5-10. Patient tolerated the procedure well. I applied aloe & after- laser balm & instructed her to use moisturizer/sunscreen daily. We will see her back in 4-6 weeks to evaluate. She will avoid sun & retinol products for approx 1 week

## 2021-03-07 NOTE — Patient Instructions (Signed)
Pt is aware that she may have redness/irritation for ~ 1 week. She will avoid sun- & use sunscreen/moisturizer. Call for any concerns.

## 2021-03-21 ENCOUNTER — Other Ambulatory Visit: Payer: Self-pay

## 2021-03-21 ENCOUNTER — Encounter: Payer: Self-pay | Admitting: Adult Health

## 2021-03-21 ENCOUNTER — Ambulatory Visit (INDEPENDENT_AMBULATORY_CARE_PROVIDER_SITE_OTHER): Payer: 59 | Admitting: Adult Health

## 2021-03-21 VITALS — BP 136/87 | HR 92 | Ht 67.0 in | Wt 264.0 lb

## 2021-03-21 DIAGNOSIS — Z6841 Body Mass Index (BMI) 40.0 and over, adult: Secondary | ICD-10-CM

## 2021-03-21 DIAGNOSIS — N9089 Other specified noninflammatory disorders of vulva and perineum: Secondary | ICD-10-CM | POA: Diagnosis not present

## 2021-03-21 DIAGNOSIS — Z01419 Encounter for gynecological examination (general) (routine) without abnormal findings: Secondary | ICD-10-CM

## 2021-03-21 DIAGNOSIS — Z713 Dietary counseling and surveillance: Secondary | ICD-10-CM | POA: Diagnosis not present

## 2021-03-21 DIAGNOSIS — Z0000 Encounter for general adult medical examination without abnormal findings: Secondary | ICD-10-CM

## 2021-03-21 MED ORDER — PHENTERMINE HCL 37.5 MG PO CAPS: 37.5000 mg | ORAL_CAPSULE | ORAL | 0 refills | Status: DC

## 2021-03-21 NOTE — Progress Notes (Signed)
Patient ID: Jacqueline Robinson, female   DOB: 1980-10-22, 40 y.o.   MRN: [id number] History of Present Illness: Jacqueline Robinson is a 40 year old white female,married, G2P2, in for a well woman gyn exam and pap. She is a new pt. And works for [employer]. Has lost almost 100 lbs in last year, going to gym 3 x a week but has reached plateau.  PCP is Dr. Juel Burrow.   Current Medications, Allergies, Past Medical History, Past Surgical History, Family History and Social History were reviewed in [employer] record.     Review of Systems: Patient denies any headaches, hearing loss, fatigue, blurred vision, shortness of breath, chest pain, abdominal pain, problems with bowel movements, urination, or intercourse. No joint pain or mood swings.     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  The vagina is normal in appearance. Urethra has no lesions or masses. The cervix is bulbous.Pap with HR HPV genotyping performed.  Uterus is felt to be normal size, shape, and contour.  No adnexal masses or tenderness noted.Bladder is non tender, no masses felt. Has several skin tags in peri area,  Extremities/musculoskeletal:  No swelling or varicosities noted, no clubbing or cyanosis Psych:  No mood changes, alert and cooperative,seems happy AA is 0 Fall risk is low Depression screen [facility] 2/9 03/21/2021   Decreased Interest 0  Down, Depressed, Hopeless 0  PHQ - 2 Score 0  Altered sleeping 0  Tired, decreased energy 0  Change in appetite 0  Feeling bad or failure about yourself  0  Trouble concentrating 0  Moving slowly or fidgety/restless 0  Suicidal thoughts 0  PHQ-9 Score 0    GAD 7 : Generalized Anxiety Score 03/21/2021  Nervous, Anxious, on Edge 0  Control/stop worrying 0  Worry too much - different things 1  Trouble relaxing 1  Restless 1  Easily annoyed or irritable 1  Afraid - awful might happen 0  Total GAD 7 Score 4      Upstream - 03/21/21 1000       Pregnancy Intention Screening   Does the patient want to become pregnant in the next year? No    Does the patient's partner want to become pregnant in the next year? No    Would the patient like to discuss contraceptive options today? No      Contraception Wrap Up   Current Method Female Sterilization    End Method Female Sterilization    Contraception Counseling Provided No            Examination chaperoned by 03/23/21 LPN   Impression and Plan: 1. Encounter for gynecological examination with Papanicolaou smear of cervix Pap sent Physical  in 1 year Pap in 3 if normal Mammogram at 41 labs with PCP  2. Skin tag of female perineum Return 04/13/21 for skin tag removal at 3:50 pm with xtra time  3. Routine general medical examination at a health care facility  4. Weight loss counseling Will rx adipex Meds ordered this encounter  Medications   phentermine 37.5 MG capsule    Sig: Take 1 capsule (37.5 mg total) by mouth every morning.    Dispense:  30 capsule    Refill:  0    Order Specific Question:   Supervising Provider    Answer:   Despina Hidden, LUTHER H [[id number]]   Recheck weight at skin tag removal appt.   5.BMI  40-44.9

## 2021-03-23 LAB — CYTOLOGY - PAP
Comment: NEGATIVE
Diagnosis: UNDETERMINED — AB
High risk HPV: NEGATIVE

## 2021-03-24 ENCOUNTER — Encounter: Payer: Self-pay | Admitting: Adult Health

## 2021-03-24 DIAGNOSIS — R87610 Atypical squamous cells of undetermined significance on cytologic smear of cervix (ASC-US): Secondary | ICD-10-CM

## 2021-03-24 HISTORY — DX: Atypical squamous cells of undetermined significance on cytologic smear of cervix (ASC-US): R87.610

## 2021-03-31 ENCOUNTER — Ambulatory Visit: Payer: 59

## 2021-04-13 ENCOUNTER — Other Ambulatory Visit: Payer: Self-pay

## 2021-04-13 ENCOUNTER — Ambulatory Visit: Payer: 59 | Admitting: Adult Health

## 2021-04-13 ENCOUNTER — Ambulatory Visit (INDEPENDENT_AMBULATORY_CARE_PROVIDER_SITE_OTHER): Payer: 59

## 2021-04-13 VITALS — BP 125/78 | Temp 97.8°F | Ht 67.0 in | Wt 253.0 lb

## 2021-04-13 DIAGNOSIS — Z719 Counseling, unspecified: Secondary | ICD-10-CM

## 2021-04-24 NOTE — Progress Notes (Signed)
04/13/21 pt in office today for evaluation of previous microderm-abrasion on 03/02/21. Patient was pleased with the microderm-abrasion procedure & she feels as if her complexion is more even & the acne scars have improved. She indicated that she has had less "oily"/breakouts" than before. Photos were taken & entered into chart.  We compared the photos today to previous photos & there is a marked improvement & scars are more superficial & less noticeable. she does want to have another microderm- abrasion session & we will schedule this for her. We discussed her avoiding sun/tanning & she will continue oil-control skin care regimen.

## 2021-04-24 NOTE — Patient Instructions (Signed)
Patient will avoid sun/tanning Call for any concerns F/u in 6-8 weeks

## 2021-05-20 ENCOUNTER — Encounter: Payer: Self-pay | Admitting: Adult Health

## 2021-05-20 ENCOUNTER — Other Ambulatory Visit: Payer: Self-pay

## 2021-05-20 ENCOUNTER — Ambulatory Visit (INDEPENDENT_AMBULATORY_CARE_PROVIDER_SITE_OTHER): Payer: 59 | Admitting: Adult Health

## 2021-05-20 ENCOUNTER — Other Ambulatory Visit (HOSPITAL_COMMUNITY)
Admission: RE | Admit: 2021-05-20 | Discharge: 2021-05-20 | Disposition: A | Discharge status: Home / Self Care | Payer: 59 | Source: Ambulatory Visit | Attending: Adult Health | Admitting: Adult Health

## 2021-05-20 VITALS — BP 134/86 | HR 90 | Ht 67.0 in | Wt 253.0 lb

## 2021-05-20 DIAGNOSIS — L918 Other hypertrophic disorders of the skin: Secondary | ICD-10-CM | POA: Diagnosis not present

## 2021-05-20 DIAGNOSIS — Z6839 Body mass index (BMI) 39.0-39.9, adult: Secondary | ICD-10-CM | POA: Diagnosis not present

## 2021-05-20 DIAGNOSIS — N9089 Other specified noninflammatory disorders of vulva and perineum: Secondary | ICD-10-CM

## 2021-05-20 DIAGNOSIS — Z713 Dietary counseling and surveillance: Secondary | ICD-10-CM | POA: Diagnosis not present

## 2021-05-20 MED ORDER — PHENTERMINE HCL 37.5 MG PO CAPS: 37.5000 mg | ORAL_CAPSULE | ORAL | 0 refills | Status: DC

## 2021-05-20 NOTE — Progress Notes (Signed)
  Subjective:     Patient ID: Jacqueline Robinson, female   DOB: 11-Mar-1981, 40 y.o.   MRN: [id number]  HPI Jacqueline Robinson is a 40 year old white female,married, G2P2 in for skin tag removal and wight check wants refill on adipex, has lost weight but off adipex currently,she did not get to come for end of July appt. PCP is Dr Juel Burrow. Lab Results  Component Value Date   DIAGPAP (A) 03/21/2021    - Atypical squamous cells of undetermined significance (ASC-US)   HPVHIGH Negative 03/21/2021     Review of Systems For skin tag removal Has lost some weight Reviewed past medical,surgical, social and family history. Reviewed medications and allergies.     Objective:   Physical Exam BP 134/86 (BP Location: Left Arm, Patient Position: Sitting, Cuff Size: Normal)   Pulse 90   Ht 5\' 7"  (1.702 m)   Wt 253 lb (114.8 kg)   LMP 05/18/2021   BMI 39.63 kg/m  Skin warm and dry.  Lungs: clear to ausculation bilaterally. Cardiovascular: regular rate and rhythm.  Has lost 11 lbs since 03/21/21. Consent signed for skin tag removal.Time out called. Has 1.5 cm skin tag right inner thigh near peri area and 2 tiny ones on left, cleansed with alcohol, injected with 2 % lidocaine, 1 cc and then cleansed with betadine, easily removed with iris scissors, then hemostat applied to base of large skin tag, removed and excised with scissors and sen to pathology. Use silver nitrate to stop slight ooze of blood and band aide applied. Pt gave verbal consent for no chaperone.  PHQ 2 score is 0  Upstream - 05/20/21 0944       Pregnancy Intention Screening   Does the patient want to become pregnant in the next year? No    Does the patient's partner want to become pregnant in the next year? No    Would the patient like to discuss contraceptive options today? No      Contraception Wrap Up   Current Method Female Sterilization    End Method Female Sterilization    Contraception Counseling Provided No                 Assessment:     1. Weight loss counseling, encounter for Continue weight loss efforts Will refill adipex Meds ordered this encounter  Medications   phentermine 37.5 MG capsule    Sig: Take 1 capsule (37.5 mg total) by mouth every morning.    Dispense:  30 capsule    Refill:  0    Order Specific Question:   Supervising Provider    Answer:   05/22/21, LUTHER H [[id number]]     2. Body mass index 39.0-39.9, adult   3. Skin tag of female perineum Skin tag to pathology Keep band aide on today    Plan:     Follow up in 4 weeks for weight and BP

## 2021-05-23 LAB — SURGICAL PATHOLOGY

## 2021-06-06 ENCOUNTER — Ambulatory Visit: Payer: 59

## 2021-06-08 ENCOUNTER — Other Ambulatory Visit: Payer: Self-pay

## 2021-06-08 ENCOUNTER — Ambulatory Visit (INDEPENDENT_AMBULATORY_CARE_PROVIDER_SITE_OTHER): Payer: Self-pay

## 2021-06-08 VITALS — BP 132/79 | HR 81 | Temp 98.2°F | Ht 67.0 in | Wt 250.0 lb

## 2021-06-08 DIAGNOSIS — Z719 Counseling, unspecified: Secondary | ICD-10-CM

## 2021-06-09 NOTE — Progress Notes (Signed)
Patient in office for micro-dermabrasion treatment to her entire face for acne scars I had performed a test area with the micro-dermabrasion unit in July- to her left upper cheek area- which she has seen an improvement of the acne scars following that treatment & she would like to have the full face treated today.  She app;lied EMLA cream 30 mins prior to procedure. I washed her face with water & alcohol. I used the medium size bit on the micro-dermabrasion machine at a speed of 25 for bilateral cheeks & then I used the small bit at a speed of 12 around eyes/forehead/mouth. Patient tolerated the procedure well. I applied aloe gel & we discussed after care. She understands to call for any concerns  I also consulted with Dr. Arita Miss & had him assess her results & review the photos. He agrees that there has been significant improvement & he suggested that she may want to have another session in 6-8 weeks

## 2021-06-09 NOTE — Patient Instructions (Signed)
Patient will use aloe gel/moisturizer as needed.  She is reminded to avoid sun exposure & wear sunscreen. She will call for any concerns.

## 2021-08-04 ENCOUNTER — Encounter: Payer: Self-pay | Admitting: Plastic Surgery

## 2021-08-04 ENCOUNTER — Other Ambulatory Visit: Payer: Self-pay

## 2021-08-04 ENCOUNTER — Ambulatory Visit (INDEPENDENT_AMBULATORY_CARE_PROVIDER_SITE_OTHER): Payer: Self-pay | Admitting: Plastic Surgery

## 2021-08-04 DIAGNOSIS — L730 Acne keloid: Secondary | ICD-10-CM

## 2021-08-04 NOTE — Progress Notes (Signed)
Patient presents for another session of dermabrasion to her face.  She has fairly severe acne scarring causing contour irregularities primarily in the malar areas and in the central forehead.  We discussed the risks and benefits of dermabrasion she is interested in moving forward.  Numbing cream has been applied ahead of time.  The face was prepped with an alcohol pad.  Dermabrasion was then performed until punctate dermal bleeding was noted in some of the more severe areas and then fanned out peripherally to blend the contour and treatment.  This was done in both malar areas and the central forehead.  She tolerated this fine.  We will see her again in 6 weeks to discuss another treatment.  All of her questions were answered.

## 2021-08-10 DIAGNOSIS — G510 Bell's palsy: Secondary | ICD-10-CM | POA: Diagnosis not present

## 2021-08-10 DIAGNOSIS — R471 Dysarthria and anarthria: Secondary | ICD-10-CM | POA: Diagnosis not present

## 2021-08-10 DIAGNOSIS — R1311 Dysphagia, oral phase: Secondary | ICD-10-CM | POA: Diagnosis not present

## 2021-08-10 DIAGNOSIS — G5133 Clonic hemifacial spasm, bilateral: Secondary | ICD-10-CM | POA: Diagnosis not present

## 2021-09-22 ENCOUNTER — Ambulatory Visit: Payer: Self-pay | Admitting: Plastic Surgery

## 2021-10-31 ENCOUNTER — Other Ambulatory Visit: Payer: Self-pay | Admitting: Internal Medicine

## 2021-11-02 ENCOUNTER — Other Ambulatory Visit: Payer: Self-pay | Admitting: Internal Medicine

## 2021-11-02 ENCOUNTER — Other Ambulatory Visit: Payer: Self-pay

## 2021-11-02 MED ORDER — SEMAGLUTIDE (1 MG/DOSE) 4 MG/3ML ~~LOC~~ SOPN: 1.0000 mg | PEN_INJECTOR | SUBCUTANEOUS | 3 refills | Status: AC

## 2021-11-03 ENCOUNTER — Other Ambulatory Visit: Payer: Self-pay | Admitting: Internal Medicine

## 2021-11-03 DIAGNOSIS — G510 Bell's palsy: Secondary | ICD-10-CM | POA: Diagnosis not present

## 2021-11-03 DIAGNOSIS — G5133 Clonic hemifacial spasm, bilateral: Secondary | ICD-10-CM | POA: Diagnosis not present

## 2021-11-30 DIAGNOSIS — Z09 Encounter for follow-up examination after completed treatment for conditions other than malignant neoplasm: Secondary | ICD-10-CM | POA: Diagnosis not present

## 2021-11-30 DIAGNOSIS — G510 Bell's palsy: Secondary | ICD-10-CM | POA: Diagnosis not present

## 2021-11-30 DIAGNOSIS — G5133 Clonic hemifacial spasm, bilateral: Secondary | ICD-10-CM | POA: Diagnosis not present

## 2021-11-30 DIAGNOSIS — R1311 Dysphagia, oral phase: Secondary | ICD-10-CM | POA: Diagnosis not present

## 2021-11-30 DIAGNOSIS — R471 Dysarthria and anarthria: Secondary | ICD-10-CM | POA: Diagnosis not present

## 2022-01-05 ENCOUNTER — Ambulatory Visit: Payer: 59 | Admitting: Women's Health

## 2022-01-31 DIAGNOSIS — G5133 Clonic hemifacial spasm, bilateral: Secondary | ICD-10-CM | POA: Diagnosis not present

## 2022-01-31 DIAGNOSIS — G510 Bell's palsy: Secondary | ICD-10-CM | POA: Diagnosis not present

## 2022-02-15 ENCOUNTER — Other Ambulatory Visit: Payer: Self-pay

## 2022-02-15 MED ORDER — SPIRONOLACTONE 50 MG PO TABS: 50.0000 mg | ORAL_TABLET | Freq: Every day | ORAL | 2 refills | Status: DC

## 2022-02-19 IMAGING — MG DIGITAL DIAGNOSTIC BILAT W/ TOMO W/ CAD
8 of 14 series · 8 of 40 positions shown · non-contrast
Comparison: None.

CLINICAL DATA: 38-year-old female with a palpable lump in the left
upper outer quadrant.

EXAM:
DIGITAL DIAGNOSTIC BILATERAL MAMMOGRAM WITH CAD AND TOMO
ULTRASOUND BILATERAL BREAST

[L XCCL synth-2D]
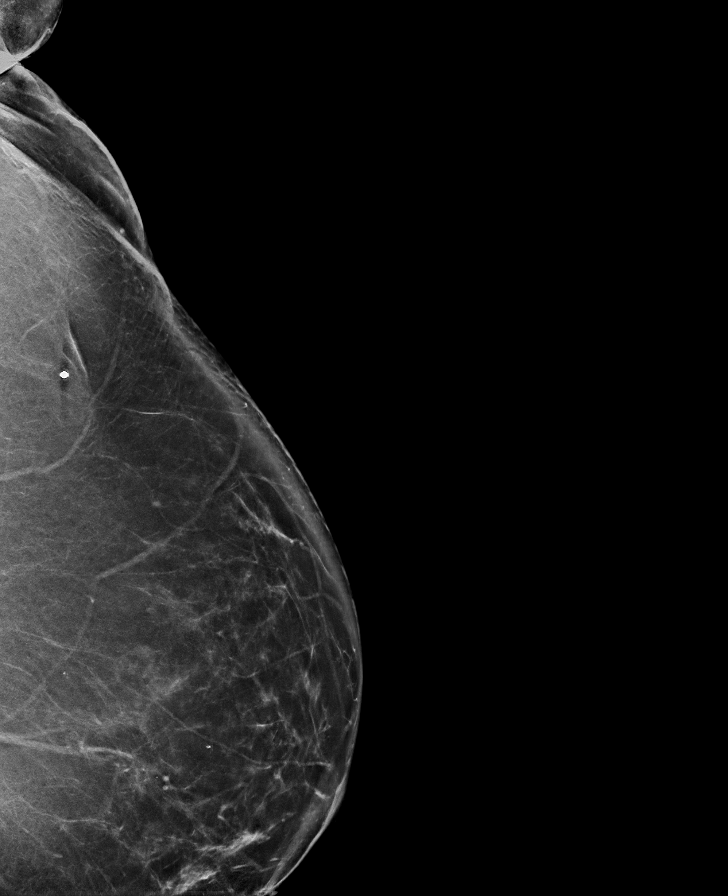

[R CC synth-2D (1 of 2)]
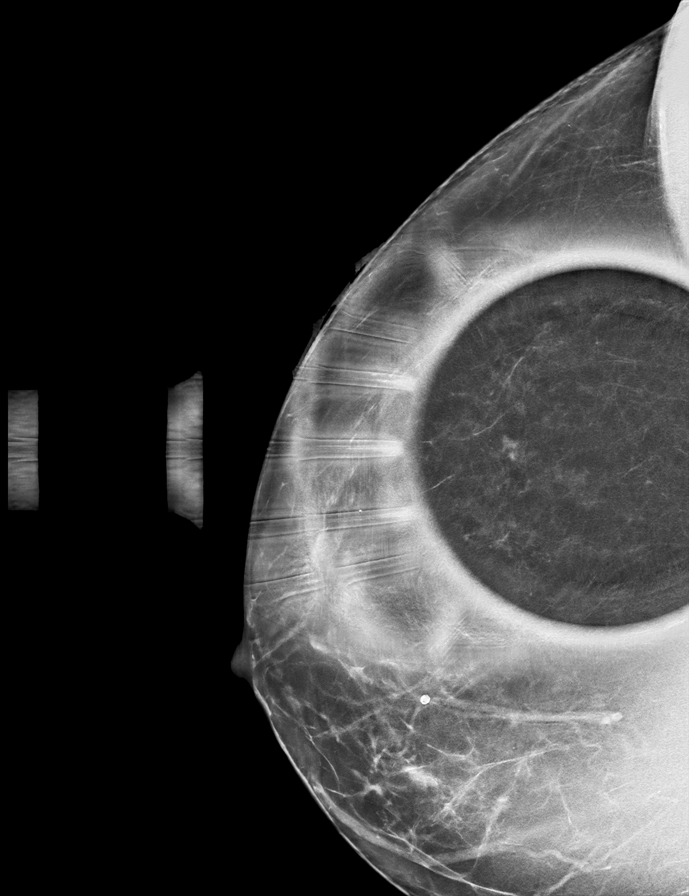

[R MLO synth-2D]
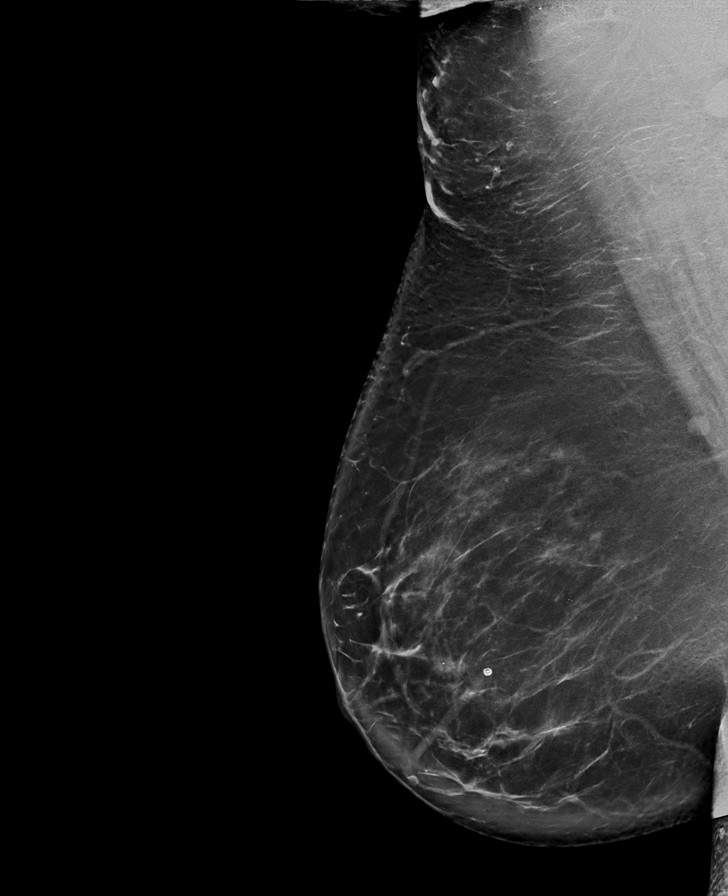

[L CC synth-2D]
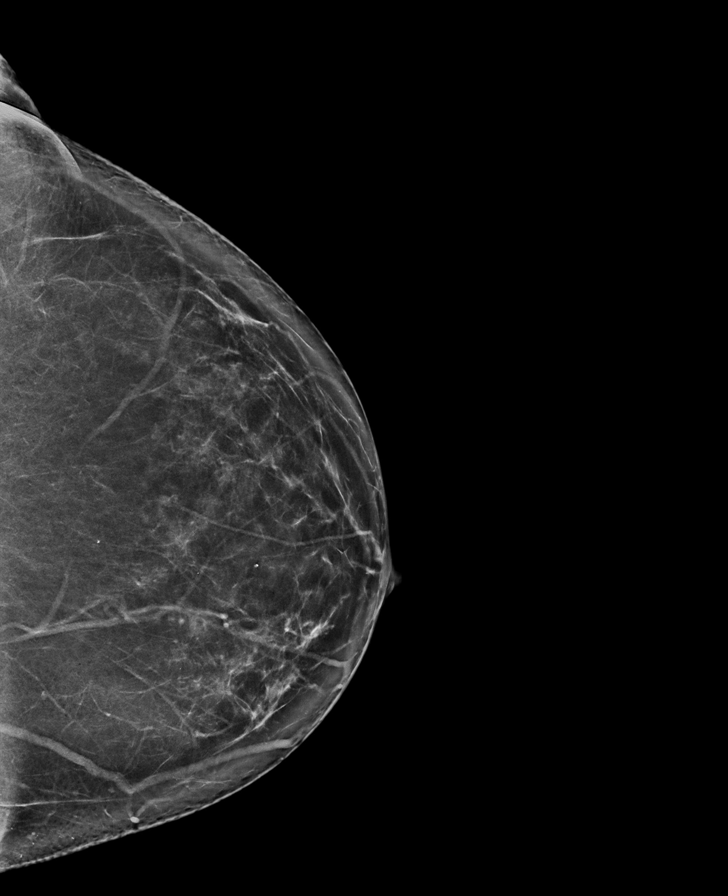

[R CC synth-2D (2 of 2)]
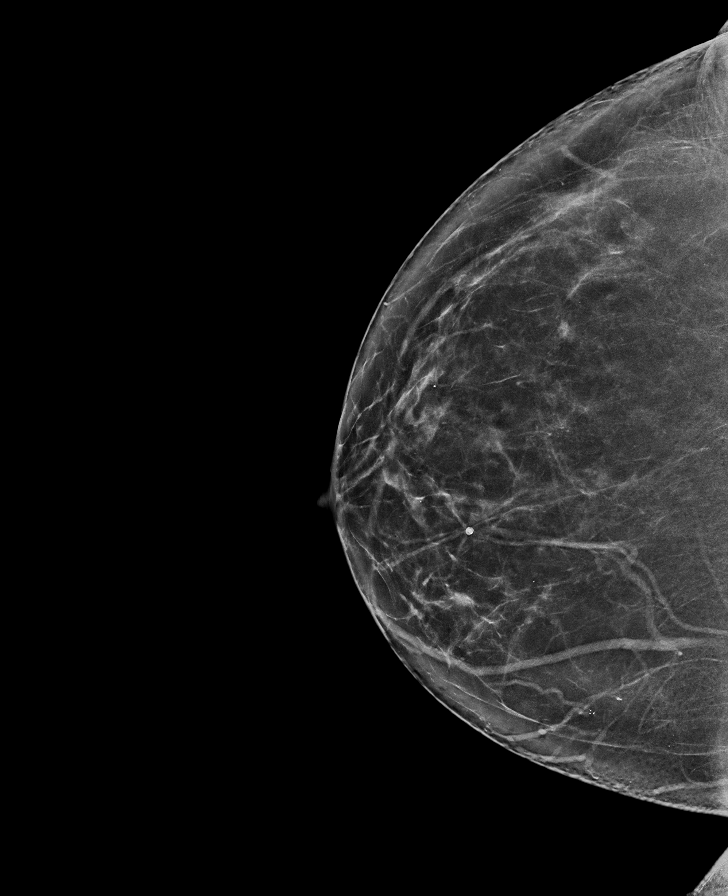

[L LMO synth-2D]
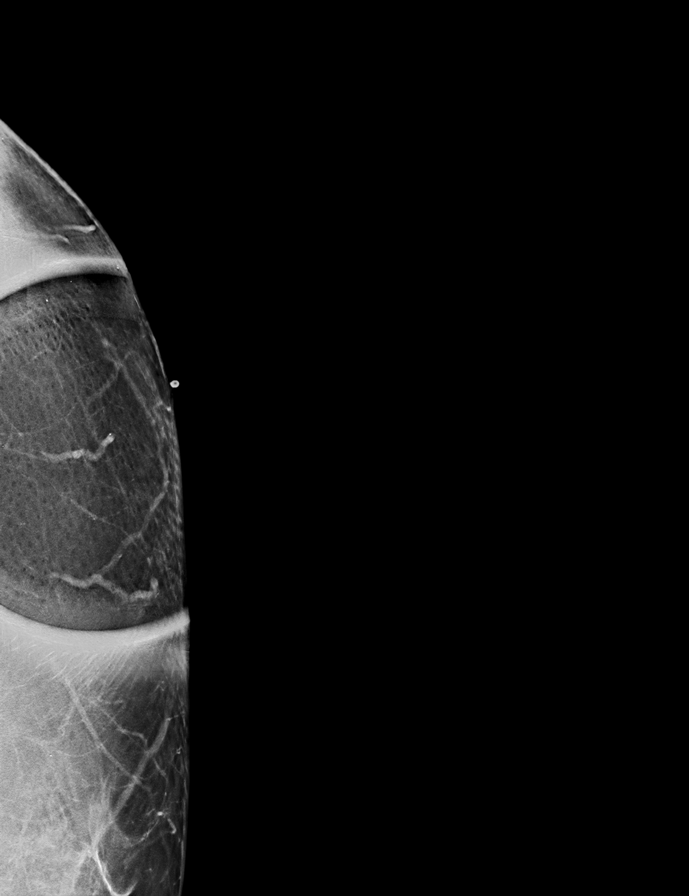

[L MLO synth-2D]
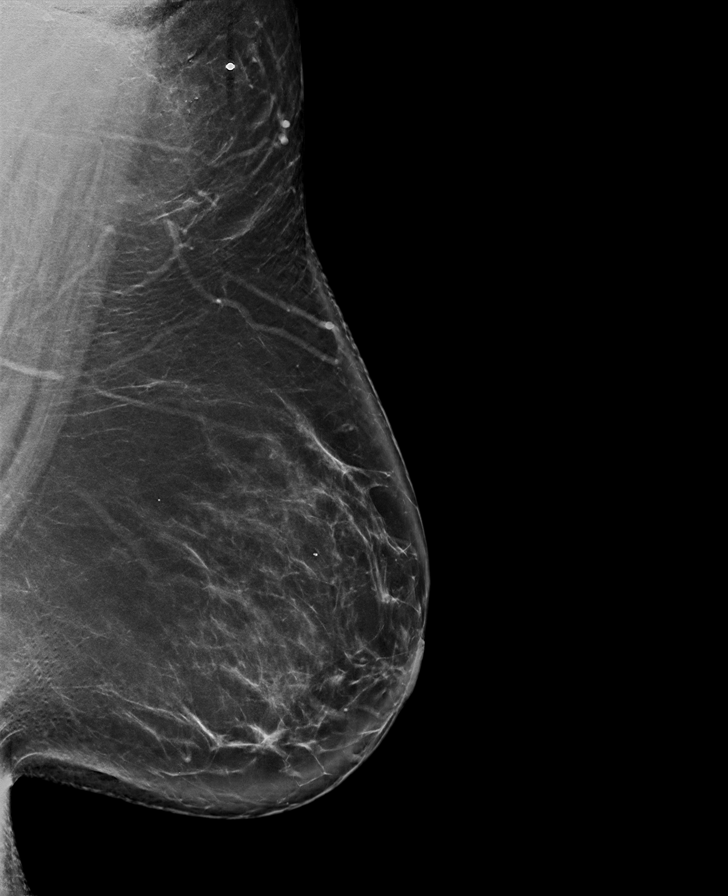

[R CC tomo · tomo slice 35/70.0]
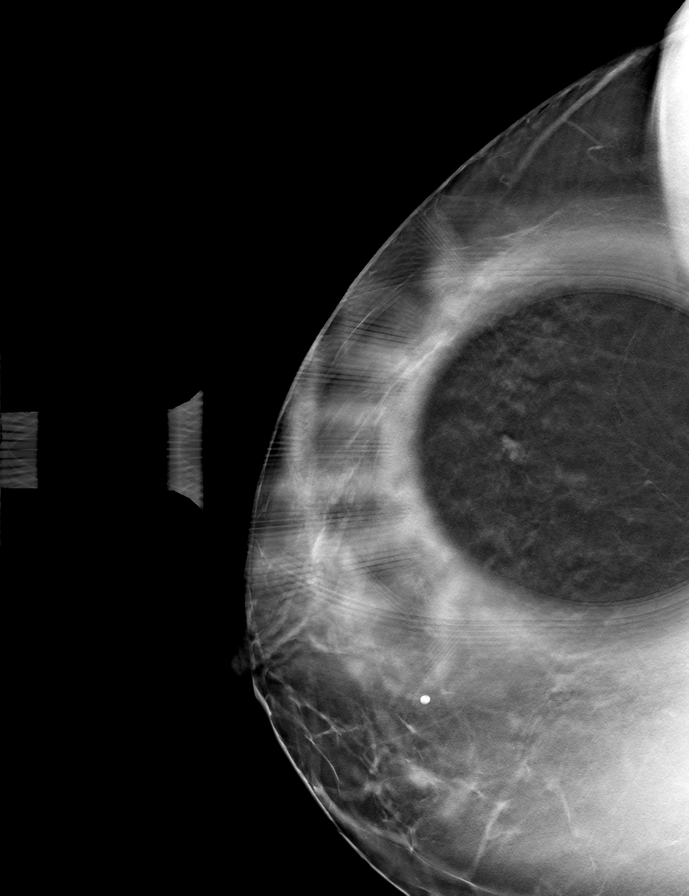

[8 of 40 positions shown; findings below may reference images not displayed]

ACR Breast Density Category b: There are scattered areas of
fibroglandular density.
FINDINGS: Radiopaque BB was placed at the site of the patient's palpable lump
in the upper outer left breast. No focal or suspicious mammographic
findings are seen deep to the radiopaque BB. No additional
suspicious findings in the left breast.

Note is made of an asymmetry in the lateral right breast at middle
depth is seen on the cc projection only. This is confirmed on spot
compression views. No correlate is seen on the MLO view.

Mammographic images were processed with CAD.

On physical exam, I palpate no suspicious lumps in the upper left
breast.

Targeted ultrasound is performed, showing normal fibroglandular
tissue without focal or suspicious sonographic abnormality in the
left breast. Evaluation at the site of the patient's palpable lump
at the [DATE] position 3 cm from the nipple was performed.

Extensive sonographic evaluation of the entire lateral right breast
was performed. No focal or suspicious sonographic abnormalities
identified.
IMPRESSION: 1. Probably benign right breast asymmetry without ultrasound
correlate. Recommendation is for short-term imaging follow-up.
2. No suspicious mammographic or sonographic findings at the site of
the patient's left breast palpable lump. Recommendation is for
clinical and symptomatic follow-up.

RECOMMENDATION:
1. Diagnostic right breast mammogram and possible ultrasound in 6
months.
2. Clinical follow-up recommended for the palpable area of concern
in the left breast. Any further workup should be based on clinical
grounds.

I have discussed the findings and recommendations with the patient.
If applicable, a reminder letter will be sent to the patient
regarding the next appointment.

BI-RADS CATEGORY  3: Probably benign.

## 2022-04-05 ENCOUNTER — Other Ambulatory Visit: Payer: Self-pay | Admitting: Internal Medicine

## 2022-04-05 MED ORDER — PHENTERMINE HCL 37.5 MG PO CAPS: 37.5000 mg | ORAL_CAPSULE | ORAL | 3 refills | Status: AC

## 2022-04-05 MED ORDER — SPIRONOLACTONE 100 MG PO TABS: 50.0000 mg | ORAL_TABLET | Freq: Two times a day (BID) | ORAL | 3 refills | Status: DC

## 2022-04-07 ENCOUNTER — Other Ambulatory Visit: Payer: Self-pay | Admitting: Internal Medicine

## 2022-04-07 MED ORDER — SPIRONOLACTONE 100 MG PO TABS: 50.0000 mg | ORAL_TABLET | Freq: Two times a day (BID) | ORAL | 3 refills | Status: DC

## 2022-05-03 DIAGNOSIS — G5133 Clonic hemifacial spasm, bilateral: Secondary | ICD-10-CM | POA: Diagnosis not present

## 2022-05-03 DIAGNOSIS — G510 Bell's palsy: Secondary | ICD-10-CM | POA: Diagnosis not present

## 2022-05-09 ENCOUNTER — Ambulatory Visit: Payer: 59

## 2022-05-09 DIAGNOSIS — Z0000 Encounter for general adult medical examination without abnormal findings: Secondary | ICD-10-CM

## 2022-05-10 LAB — COMPLETE METABOLIC PANEL WITH GFR
AG Ratio: 1.4 (calc) (ref 1.0–2.5)
ALT: 21 U/L (ref 6–29)
AST: 21 U/L (ref 10–30)
Albumin: 4.1 g/dL (ref 3.6–5.1)
Alkaline phosphatase (APISO): 77 U/L (ref 31–125)
BUN: 9 mg/dL (ref 7–25)
CO2: 19 mmol/L — ABNORMAL LOW (ref 20–32)
Calcium: 9.5 mg/dL (ref 8.6–10.2)
Chloride: 107 mmol/L (ref 98–110)
Creat: 0.74 mg/dL (ref 0.50–0.97)
Globulin: 2.9 g/dL (calc) (ref 1.9–3.7)
Glucose, Bld: 77 mg/dL (ref 65–99)
Potassium: 4.4 mmol/L (ref 3.5–5.3)
Sodium: 137 mmol/L (ref 135–146)
Total Bilirubin: 0.5 mg/dL (ref 0.2–1.2)
Total Protein: 7 g/dL (ref 6.1–8.1)
eGFR: 105 mL/min/{1.73_m2} (ref >=60)

## 2022-05-10 LAB — CBC WITH DIFFERENTIAL/PLATELET
Absolute Monocytes: 812 cells/uL (ref 200–950)
Basophils Absolute: 52 cells/uL (ref 0–200)
Basophils Relative: 0.4 %
Eosinophils Absolute: 262 cells/uL (ref 15–500)
Eosinophils Relative: 2 %
HCT: 43.6 % (ref 35.0–45.0)
Hemoglobin: 15 g/dL (ref 11.7–15.5)
Lymphs Abs: 2738 cells/uL (ref 850–3900)
MCH: 29.4 pg (ref 27.0–33.0)
MCHC: 34.4 g/dL (ref 32.0–36.0)
MCV: 85.5 fL (ref 80.0–100.0)
MPV: 9.9 fL (ref 7.5–12.5)
Monocytes Relative: 6.2 %
Neutro Abs: 9236 cells/uL — ABNORMAL HIGH (ref 1500–7800)
Neutrophils Relative %: 70.5 %
Platelets: 376 10*3/uL (ref 140–400)
RBC: 5.1 10*6/uL (ref 3.80–5.10)
RDW: 14.4 % (ref 11.0–15.0)
Total Lymphocyte: 20.9 %
WBC: 13.1 10*3/uL — ABNORMAL HIGH (ref 3.8–10.8)

## 2022-05-10 LAB — LIPID PANEL
Cholesterol: 187 mg/dL (ref <200)
HDL: 48 mg/dL — ABNORMAL LOW (ref >=50)
LDL Cholesterol (Calc): 122 mg/dL (calc) — ABNORMAL HIGH
Non-HDL Cholesterol (Calc): 139 mg/dL (calc) — ABNORMAL HIGH (ref <130)
Total CHOL/HDL Ratio: 3.9 (calc) (ref <5.0)
Triglycerides: 80 mg/dL (ref <150)

## 2022-05-10 LAB — TSH: TSH: 2.52 mIU/L

## 2022-05-17 ENCOUNTER — Other Ambulatory Visit: Payer: Self-pay | Admitting: Internal Medicine

## 2022-05-17 MED ORDER — DOXYCYCLINE HYCLATE 100 MG PO TABS: 100.0000 mg | ORAL_TABLET | Freq: Every day | ORAL | 1 refills | Status: AC

## 2022-05-17 MED ORDER — SPIRONOLACTONE 100 MG PO TABS: 100.0000 mg | ORAL_TABLET | Freq: Two times a day (BID) | ORAL | 3 refills | Status: AC

## 2022-05-19 ENCOUNTER — Ambulatory Visit (INDEPENDENT_AMBULATORY_CARE_PROVIDER_SITE_OTHER): Payer: 59 | Admitting: Nurse Practitioner

## 2022-05-19 ENCOUNTER — Encounter: Payer: Self-pay | Admitting: Nurse Practitioner

## 2022-05-19 VITALS — BP 132/82 | HR 72 | Ht 68.0 in | Wt 220.1 lb

## 2022-05-19 DIAGNOSIS — Z6833 Body mass index (BMI) 33.0-33.9, adult: Secondary | ICD-10-CM

## 2022-05-19 DIAGNOSIS — Z716 Tobacco abuse counseling: Secondary | ICD-10-CM | POA: Diagnosis not present

## 2022-05-19 DIAGNOSIS — Z0000 Encounter for general adult medical examination without abnormal findings: Secondary | ICD-10-CM | POA: Insufficient documentation

## 2022-05-19 DIAGNOSIS — E669 Obesity, unspecified: Secondary | ICD-10-CM | POA: Diagnosis not present

## 2022-05-19 MED ORDER — WEGOVY 0.25 MG/0.5ML ~~LOC~~ SOAJ: 0.2500 mg | SUBCUTANEOUS | 1 refills | Status: AC

## 2022-05-19 NOTE — Progress Notes (Signed)
Established Patient Office Visit  Subjective:  Patient ID: Jacqueline Robinson, female    DOB: 04/30/1981  Age: 41 y.o. MRN: [id number]  CC: No chief complaint on file.    HPI  MECHEL HAGGARD presents Patient presents to the clinic for his/her annual physical exam.  Flu: 2022  Tetanus:2020 COVID:No Pap smear: 2022 Dentist:as needed Eye examination: every 6 months Exercise: every day 2 miles  Diet: Patient does eat meat. Patient consumes fruits and veggies. Patient eat fried food rarely.  Patient drinks water, tea  and coffee      HPI   Past Medical History:  Diagnosis Date   Atypical squamous cell changes of undetermined significance (ASCUS) on cervical cytology with negative high risk human papilloma virus (HPV) test result 03/24/2021   03/24/21 repeat pap in 3 years per [facility]   Back pain    Strep throat     Past Surgical History:  Procedure Laterality Date   CESAREAN SECTION N/A    ORIF ANKLE FRACTURE Right 08/10/2016   Procedure: OPEN REDUCTION INTERNAL FIXATION (ORIF) ANKLE FRACTURE;  Surgeon: Hessie Knows, MD;  Location: [facility] ORS;  Service: Orthopedics;  Laterality: Right;   TUBAL LIGATION      Family History  Problem Relation Age of Onset   Diabetes Paternal Grandfather    Breast cancer Paternal Grandmother 63   Hypertension Father    Diabetes Father    Breast cancer Maternal Aunt 58    Social History   Socioeconomic History   Marital status: Married    Spouse name: Not on file   Number of children: Not on file   Years of education: Not on file   Highest education level: Not on file  Occupational History   Not on file  Tobacco Use   Smoking status: Some Days    Packs/day: 0.50    Types: Cigarettes    Last attempt to quit: 06/10/2016    Years since quitting: 5.9   Smokeless tobacco: Never  Vaping Use   Vaping Use: Never used  Substance and Sexual Activity   Alcohol use: No   Drug use: No   Sexual activity: Yes    Birth  control/protection: Surgical    Comment: tubal  Other Topics Concern   Not on file  Social History Narrative   Not on file   Social Determinants of Health   Financial Resource Strain: Low Risk  (03/21/2021)   Overall Financial Resource Strain (CARDIA)    Difficulty of Paying Living Expenses: Not very hard  Food Insecurity: No Food Insecurity (03/21/2021)   Hunger Vital Sign    Worried About Running Out of Food in the Last Year: Never true    Ran Out of Food in the Last Year: Never true  Transportation Needs: No Transportation Needs (03/21/2021)   PRAPARE - Hydrologist (Medical): No    Lack of Transportation (Non-Medical): No  Physical Activity: Sufficiently Active (03/21/2021)   Exercise Vital Sign    Days of Exercise per Week: 3 days    Minutes of Exercise per Session: 100 min  Stress: No Stress Concern Present (03/21/2021)   [place]    Feeling of Stress : Only a little  Social Connections: Moderately Integrated (03/21/2021)   Social Connection and Isolation Panel [NHANES]    Frequency of Communication with Friends and Family: More than three times a week    Frequency of Social Gatherings with Friends  and Family: More than three times a week    Attends Religious Services: More than 4 times per year    Active Member of Clubs or Organizations: No    Attends Archivist Meetings: Never    Marital Status: Married  Human resources officer Violence: Not At Risk (03/21/2021)   Humiliation, Afraid, Rape, and Kick questionnaire    Fear of Current or Ex-Partner: No    Emotionally Abused: No    Physically Abused: No    Sexually Abused: No     Outpatient Medications Prior to Visit  Medication Sig Dispense Refill   doxycycline (VIBRA-TABS) 100 MG tablet Take 1 tablet (100 mg total) by mouth daily. 30 tablet 1   phentermine 37.5 MG capsule Take 1 capsule (37.5 mg total) by mouth every  morning. 30 capsule 3   Semaglutide, 1 MG/DOSE, 4 MG/3ML SOPN Inject 1 mg as directed once a week. 3 mL 3   spironolactone (ALDACTONE) 100 MG tablet Take 1 tablet (100 mg total) by mouth 2 (two) times daily. 180 tablet 3   No facility-administered medications prior to visit.    No Known Allergies  ROS Review of Systems  Constitutional: Negative.   HENT: Negative.    Eyes: Negative.   Respiratory: Negative.    Cardiovascular: Negative.   Gastrointestinal: Negative.   Genitourinary: Negative.   Musculoskeletal: Negative.   Neurological: Negative.   Hematological: Negative.   Psychiatric/Behavioral: Negative.        Objective:    Physical Exam Constitutional:      Appearance: Normal appearance. She is obese.  HENT:     Head: Normocephalic.     Right Ear: Tympanic membrane normal.     Left Ear: Tympanic membrane normal.     Nose: Nose normal.     Mouth/Throat:     Mouth: Mucous membranes are moist.     Pharynx: Oropharynx is clear.  Eyes:     Extraocular Movements: Extraocular movements intact.     Conjunctiva/sclera: Conjunctivae normal.     Pupils: Pupils are equal, round, and reactive to light.  Cardiovascular:     Rate and Rhythm: Normal rate and regular rhythm.     Pulses: Normal pulses.     Heart sounds: Normal heart sounds.  Pulmonary:     Effort: Pulmonary effort is normal. No respiratory distress.     Breath sounds: Normal breath sounds. No rhonchi.  Abdominal:     General: Bowel sounds are normal.     Palpations: Abdomen is soft. There is no mass.     Tenderness: There is no abdominal tenderness.     Hernia: No hernia is present.  Musculoskeletal:        General: Normal range of motion.     Cervical back: Neck supple. No tenderness.  Skin:    General: Skin is warm.     Capillary Refill: Capillary refill takes less than 2 seconds.  Neurological:     General: No focal deficit present.     Mental Status: She is alert and oriented to person, place, and  time. Mental status is at baseline.  Psychiatric:        Mood and Affect: Mood normal.        Behavior: Behavior normal.        Thought Content: Thought content normal.        Judgment: Judgment normal.     BP 132/82   Pulse 72   Ht '5\' 8"'  (1.727 m)   Wt 220  lb 2 oz (99.8 kg)   BMI 33.47 kg/m  Wt Readings from Last 3 Encounters:  05/19/22 220 lb 2 oz (99.8 kg)  06/08/21 250 lb (113.4 kg)  05/20/21 253 lb (114.8 kg)     Health Maintenance  Topic Date Due   COVID-19 Vaccine (1) Never done   HIV Screening  Never done   Hepatitis C Screening  Never done   TETANUS/TDAP  Never done   INFLUENZA VACCINE  04/25/2022   PAP SMEAR-Modifier  03/21/2024   HPV VACCINES  Aged Out    There are no preventive care reminders to display for this patient.  Lab Results  Component Value Date   TSH 2.52 05/09/2022   Lab Results  Component Value Date   WBC 13.1 (H) 05/09/2022   HGB 15.0 05/09/2022   HCT 43.6 05/09/2022   MCV 85.5 05/09/2022   PLT 376 05/09/2022   Lab Results  Component Value Date   NA 137 05/09/2022   K 4.4 05/09/2022   CO2 19 (L) 05/09/2022   GLUCOSE 77 05/09/2022   BUN 9 05/09/2022   CREATININE 0.74 05/09/2022   BILITOT 0.5 05/09/2022   AST 21 05/09/2022   ALT 21 05/09/2022   PROT 7.0 05/09/2022   CALCIUM 9.5 05/09/2022   EGFR 105 05/09/2022   Lab Results  Component Value Date   CHOL 187 05/09/2022   Lab Results  Component Value Date   HDL 48 (L) 05/09/2022   Lab Results  Component Value Date   LDLCALC 122 (H) 05/09/2022   Lab Results  Component Value Date   TRIG 80 05/09/2022   Lab Results  Component Value Date   CHOLHDL 3.9 05/09/2022   No results found for: "HGBA1C"    Assessment & Plan:   Problem List Items Addressed This Visit       Other   Tobacco abuse counseling    Smoking cessation was discussed.      Annual physical exam - Primary    Encouraged patient to consume a balanced diet and regular exercise regimen. Advised to  see an eye doctor and dentist annually.        Class 1 obesity without serious comorbidity with body mass index (BMI) of 33.0 to 33.9 in adult    Body mass index is 33.47 kg/m. Advised pt to lose weight. Advised patient to avoid trans fat, fatty and fried food. Follow a regular physical activity schedule. Will start her on wegovy for weight loss.           Relevant Medications   Semaglutide-Weight Management (WEGOVY) 0.25 MG/0.5ML SOAJ     Meds ordered this encounter  Medications   Semaglutide-Weight Management (WEGOVY) 0.25 MG/0.5ML SOAJ    Sig: Inject 0.25 mg into the skin once a week.    Dispense:  0.5 mL    Refill:  1     Follow-up: Return in about 1 year (around 05/20/2023) for anuual physical.    Theresia Lo, NP

## 2022-05-19 NOTE — Assessment & Plan Note (Signed)
Smoking cessation was discussed. 

## 2022-05-19 NOTE — Assessment & Plan Note (Signed)
Body mass index is 33.47 kg/m. Advised pt to lose weight. Advised patient to avoid trans fat, fatty and fried food. Follow a regular physical activity schedule. Will start her on wegovy for weight loss.

## 2022-05-19 NOTE — Assessment & Plan Note (Signed)
Encouraged patient to consume a balanced diet and regular exercise regimen. Advised to see an eye doctor and dentist annually.   

## 2022-05-23 ENCOUNTER — Ambulatory Visit: Payer: 59 | Admitting: Internal Medicine

## 2022-05-23 DIAGNOSIS — Z20822 Contact with and (suspected) exposure to covid-19: Secondary | ICD-10-CM

## 2022-05-23 DIAGNOSIS — W57XXXA Bitten or stung by nonvenomous insect and other nonvenomous arthropods, initial encounter: Secondary | ICD-10-CM | POA: Diagnosis not present

## 2022-05-23 DIAGNOSIS — S30860A Insect bite (nonvenomous) of lower back and pelvis, initial encounter: Secondary | ICD-10-CM | POA: Diagnosis not present

## 2022-05-23 LAB — POC COVID19 BINAXNOW: SARS Coronavirus 2 Ag: NEGATIVE

## 2022-05-23 MED ORDER — DOXYCYCLINE HYCLATE 100 MG PO TABS: 100.0000 mg | ORAL_TABLET | Freq: Two times a day (BID) | ORAL | 0 refills | Status: AC

## 2022-05-23 NOTE — Progress Notes (Unsigned)
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  Rule out COVID 19 due to close exposure on 05/22/22 test is negative at this time

## 2022-05-24 LAB — COMPREHENSIVE METABOLIC PANEL
AG Ratio: 1.3 (calc) (ref 1.0–2.5)
ALT: 18 U/L (ref 6–29)
AST: 16 U/L (ref 10–30)
Albumin: 3.6 g/dL (ref 3.6–5.1)
Alkaline phosphatase (APISO): 74 U/L (ref 31–125)
BUN: 7 mg/dL (ref 7–25)
CO2: 24 mmol/L (ref 20–32)
Calcium: 9.1 mg/dL (ref 8.6–10.2)
Chloride: 107 mmol/L (ref 98–110)
Creat: 0.61 mg/dL (ref 0.50–0.97)
Globulin: 2.7 g/dL (calc) (ref 1.9–3.7)
Glucose, Bld: 103 mg/dL — ABNORMAL HIGH (ref 65–99)
Potassium: 3.6 mmol/L (ref 3.5–5.3)
Sodium: 138 mmol/L (ref 135–146)
Total Bilirubin: 0.4 mg/dL (ref 0.2–1.2)
Total Protein: 6.3 g/dL (ref 6.1–8.1)

## 2022-05-24 LAB — CBC WITH DIFFERENTIAL/PLATELET
Absolute Monocytes: 594 cells/uL (ref 200–950)
Basophils Absolute: 30 cells/uL (ref 0–200)
Basophils Relative: 0.5 %
Eosinophils Absolute: 102 cells/uL (ref 15–500)
Eosinophils Relative: 1.7 %
HCT: 42.9 % (ref 35.0–45.0)
Hemoglobin: 14.5 g/dL (ref 11.7–15.5)
Lymphs Abs: 1038 cells/uL (ref 850–3900)
MCH: 28.9 pg (ref 27.0–33.0)
MCHC: 33.8 g/dL (ref 32.0–36.0)
MCV: 85.5 fL (ref 80.0–100.0)
MPV: 9.8 fL (ref 7.5–12.5)
Monocytes Relative: 9.9 %
Neutro Abs: 4236 cells/uL (ref 1500–7800)
Neutrophils Relative %: 70.6 %
Platelets: 286 10*3/uL (ref 140–400)
RBC: 5.02 10*6/uL (ref 3.80–5.10)
RDW: 14.5 % (ref 11.0–15.0)
Total Lymphocyte: 17.3 %
WBC: 6 10*3/uL (ref 3.8–10.8)

## 2022-05-26 LAB — ROCKY MTN SPOTTED FVR AB, IGG-BLOOD: RMSF IgG: NEGATIVE

## 2022-08-10 ENCOUNTER — Other Ambulatory Visit (HOSPITAL_COMMUNITY): Payer: Self-pay

## 2022-08-10 ENCOUNTER — Telehealth: Payer: Self-pay

## 2022-08-10 NOTE — Telephone Encounter (Signed)
Pharmacy Patient Advocate Encounter   Received notification from [facility] that prior authorization for [facility] 0.25mg /0.36ml is required/requested.    PA submitted on 08/10/22 to [employer]  via CoverMyMeds  Key [id number]  Status is pending

## 2022-08-14 NOTE — Telephone Encounter (Signed)
Received notification from [facility] regarding a prior authorization for Wegovy 0.25mg /0.31ml.   Authorization has been APPROVED from 08/14/22 to 03/05/23.   Authorization Reference Number # [id number]   Approval letter scanned to chart
